# Patient Record
Sex: Female | Born: 1995 | Hispanic: No | Marital: Single | Smoking: Never smoker
Health system: Southern US, Community
[De-identification: ages and names within clinical notes are randomized; demographics above are authoritative.]

## PROBLEM LIST (undated history)

## (undated) DIAGNOSIS — J45909 Unspecified asthma, uncomplicated: Secondary | ICD-10-CM

## (undated) DIAGNOSIS — B009 Herpesviral infection, unspecified: Secondary | ICD-10-CM

## (undated) HISTORY — PX: NO PAST SURGERIES: SHX2092

## (undated) HISTORY — DX: Herpesviral infection, unspecified: B00.9

---
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
## 2017-01-15 ENCOUNTER — Ambulatory Visit: Payer: Self-pay

## 2017-01-16 ENCOUNTER — Ambulatory Visit (INDEPENDENT_AMBULATORY_CARE_PROVIDER_SITE_OTHER): Payer: Self-pay | Admitting: Family Medicine

## 2017-01-16 VITALS — BP 110/63 | HR 58

## 2017-01-16 DIAGNOSIS — Z23 Encounter for immunization: Secondary | ICD-10-CM

## 2017-01-16 NOTE — Progress Notes (Signed)
Patient came into clinic today for PPD test placement. 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 Pt has never tested positive in the past. Pt tolerated PPD placement in left forearm well, no immediate complications. She will return in 2 days for PPD read. She will need print out of negative test.

## 2017-01-17 NOTE — Progress Notes (Signed)
  Agree with above. Forms completed  Nani Gasseratherine Genevieve Ritzel, MD

## 2017-01-18 ENCOUNTER — Ambulatory Visit: Payer: Self-pay

## 2017-01-18 ENCOUNTER — Ambulatory Visit (INDEPENDENT_AMBULATORY_CARE_PROVIDER_SITE_OTHER): Payer: Self-pay | Admitting: Family Medicine

## 2017-01-18 VITALS — BP 110/64 | HR 62 | Wt 121.0 lb

## 2017-01-18 DIAGNOSIS — Z111 Encounter for screening for respiratory tuberculosis: Secondary | ICD-10-CM

## 2017-01-18 LAB — TB SKIN TEST
Induration: 0 mm
TB Skin Test: NEGATIVE

## 2017-01-18 NOTE — Progress Notes (Signed)
Agree with above.  Catherine Metheney, MD  

## 2017-01-18 NOTE — Progress Notes (Signed)
Patient is here for PPD reading. Results were negative 0 mm.

## 2017-02-01 ENCOUNTER — Encounter: Payer: Self-pay | Admitting: Obstetrics & Gynecology

## 2017-02-01 ENCOUNTER — Ambulatory Visit (INDEPENDENT_AMBULATORY_CARE_PROVIDER_SITE_OTHER): Payer: Self-pay | Admitting: Obstetrics & Gynecology

## 2017-02-01 VITALS — BP 105/63 | HR 69 | Resp 16 | Ht 62.0 in | Wt 118.0 lb

## 2017-02-01 DIAGNOSIS — N9089 Other specified noninflammatory disorders of vulva and perineum: Secondary | ICD-10-CM

## 2017-02-01 NOTE — Progress Notes (Signed)
   Subjective:    Patient ID: Sheila [name], female    DOB: 1996-05-28, 21 y.o.   MRN: [id number]  HPI 21yo S AA G0 here with a 1 week h/o vulvar irritation. She is sexually active and monogamous for a year. She thinks that her vulva looks different. She reports that she has had STI testing with this partner and is negative  Review of Systems Her mother is with her and describes her as "dramatic".    Objective:   Physical Exam Well nourished, well hydrated black female, no apparent distress Breathing, conversing, and ambulating normally Her vulva appears entirely normal, normal discharge noted        Assessment & Plan:  Vulvar irritation/appreciated difference- reassurance given Preventative care- info given about Gardasil, rec'd that she get it in the future.

## 2017-04-12 ENCOUNTER — Other Ambulatory Visit: Payer: Self-pay | Admitting: Family Medicine

## 2017-05-08 ENCOUNTER — Other Ambulatory Visit: Payer: Self-pay | Admitting: *Deleted

## 2017-05-08 MED ORDER — NORGESTIM-ETH ESTRAD TRIPHASIC 0.18/0.215/0.25 MG-25 MCG PO TABS: 1.0000 | ORAL_TABLET | Freq: Every day | ORAL | 0 refills | Status: DC

## 2017-06-06 ENCOUNTER — Other Ambulatory Visit: Payer: Self-pay | Admitting: Family Medicine

## 2017-10-18 ENCOUNTER — Other Ambulatory Visit: Payer: Self-pay | Admitting: Family Medicine

## 2017-10-24 ENCOUNTER — Other Ambulatory Visit: Payer: Self-pay

## 2017-10-24 MED ORDER — NORGESTIM-ETH ESTRAD TRIPHASIC 0.18/0.215/0.25 MG-25 MCG PO TABS: 1.0000 | ORAL_TABLET | Freq: Every day | ORAL | 0 refills | Status: DC

## 2017-10-24 NOTE — Telephone Encounter (Signed)
Pt's mother called stating pt needs RF on Birth Control.  Called mother back and left her a VM stating we can send one more month of RFs but pt is due for OV before we can further refill.

## 2017-11-17 ENCOUNTER — Other Ambulatory Visit: Payer: Self-pay | Admitting: Family Medicine

## 2017-11-21 ENCOUNTER — Other Ambulatory Visit: Payer: Self-pay

## 2017-11-21 MED ORDER — NORGESTIM-ETH ESTRAD TRIPHASIC 0.18/0.215/0.25 MG-25 MCG PO TABS: 1.0000 | ORAL_TABLET | Freq: Every day | ORAL | 1 refills | Status: DC

## 2017-11-21 NOTE — Telephone Encounter (Signed)
Pt's mother called and scheduled pt's yearly physical for her. Mother requesting enough of pt's birth control be sent to pharmacy to last until appt on 12-24-17.  RX sent and mother advised that pt has to keep that appt for further refills

## 2017-12-24 ENCOUNTER — Encounter: Payer: Self-pay | Admitting: Family Medicine

## 2017-12-25 ENCOUNTER — Encounter: Payer: Self-pay | Admitting: Family Medicine

## 2017-12-25 ENCOUNTER — Ambulatory Visit (INDEPENDENT_AMBULATORY_CARE_PROVIDER_SITE_OTHER): Payer: Self-pay | Admitting: Family Medicine

## 2017-12-25 VITALS — BP 109/59 | HR 57 | Ht 62.0 in | Wt 122.0 lb

## 2017-12-25 DIAGNOSIS — Z3002 Counseling and instruction in natural family planning to avoid pregnancy: Secondary | ICD-10-CM

## 2017-12-25 DIAGNOSIS — F419 Anxiety disorder, unspecified: Secondary | ICD-10-CM

## 2017-12-25 DIAGNOSIS — R5383 Other fatigue: Secondary | ICD-10-CM

## 2017-12-25 DIAGNOSIS — Z0000 Encounter for general adult medical examination without abnormal findings: Secondary | ICD-10-CM

## 2017-12-25 DIAGNOSIS — F32A Depression, unspecified: Secondary | ICD-10-CM

## 2017-12-25 DIAGNOSIS — F329 Major depressive disorder, single episode, unspecified: Secondary | ICD-10-CM

## 2017-12-25 DIAGNOSIS — Z113 Encounter for screening for infections with a predominantly sexual mode of transmission: Secondary | ICD-10-CM

## 2017-12-25 NOTE — Progress Notes (Signed)
Subjective:     Sheila [name] is a 22 y.o. female and is here for a comprehensive physical exam. The patient reports problems - would like to change birth control.  Currently on the birth control pill but would like to switch medications.  She has been feeling more down and sad and anxious recently.  He says she just does not know why.  She cannot quite put her finger on it.  Right now she is doing summer school classes online at [employer] and also working some.  She is Engineering geologistcoaching cheerleading.  She also wanted to let me know that last Thursday, approximately 6 days ago, she was dropped twice and hit her head.  She is had some pain and discomfort in the back of her head radiating upward.  She says she notices that the headache is worse if she is been trying to focus on something such as reading etc.   She has had some home stressors.   Social History   Socioeconomic History  . Marital status: Single    Spouse name: Not on file  . Number of children: Not on file  . Years of education: Not on file  . Highest education level: Not on file  Occupational History  . Occupation: Consulting civil engineertudent  Social Needs  . Financial resource strain: Not on file  . Food insecurity:    Worry: Not on file    Inability: Not on file  . Transportation needs:    Medical: Not on file    Non-medical: Not on file  Tobacco Use  . Smoking status: Never Smoker  . Smokeless tobacco: Never Used  Substance and Sexual Activity  . Alcohol use: No  . Drug use: No  . Sexual activity: Not on file  Lifestyle  . Physical activity:    Days per week: Not on file    Minutes per session: Not on file  . Stress: Not on file  Relationships  . Social connections:    Talks on phone: Not on file    Gets together: Not on file    Attends religious service: Not on file    Active member of club or organization: Not on file    Attends meetings of clubs or organizations: Not on file    Relationship status: Not on file  . Intimate partner  violence:    Fear of current or ex partner: Not on file    Emotionally abused: Not on file    Physically abused: Not on file    Forced sexual activity: Not on file  Other Topics Concern  . Not on file  Social History Narrative   Track and cheerleading.    Health Maintenance  Topic Date Due  . HIV Screening  09/27/2012  . INFLUENZA VACCINE  01/03/2018  . TETANUS/TDAP  01/16/2018    The following portions of the patient's history were reviewed and updated as appropriate: allergies, current medications, past family history, past medical history, past social history, past surgical history and problem list.  Review of Systems A comprehensive review of systems was negative.   Objective:    BP (!) 109/59   Pulse (!) 57   Ht 5\' 2"  (1.575 m)   Wt 122 lb (55.3 kg)   LMP 12/19/2017   BMI 22.31 kg/m  General appearance: alert, cooperative and appears stated age Head: Normocephalic, without obvious abnormality, atraumatic Eyes: conj clear, EOMi, PEERLA Ears: normal TM's and external ear canals both ears Nose: Nares normal. Septum midline.  Mucosa normal. No drainage or sinus tenderness. Throat: lips, mucosa, and tongue normal; teeth and gums normal Neck: no adenopathy, no carotid bruit, no JVD, supple, symmetrical, trachea midline and thyroid not enlarged, symmetric, no tenderness/mass/nodules Back: symmetric, no curvature. ROM normal. No CVA tenderness. Lungs: clear to auscultation bilaterally Heart: regular rate and rhythm, S1, S2 normal, no murmur, click, rub or gallop Abdomen: soft, non-tender; bowel sounds normal; no masses,  no organomegaly Extremities: extremities normal, atraumatic, no cyanosis or edema Pulses: 2+ and symmetric Skin: Skin color, texture, turgor normal. No rashes or lesions Lymph nodes: Cervical adenopathy: nl Neurologic: Alert and oriented X 3, normal strength and tone. Normal symmetric reflexes. Normal coordination and gait    Assessment:    Healthy  female exam.      Plan:     See After Visit Summary for Counseling Recommendations   Keep up a regular exercise program and make sure you are eating a healthy diet Try to eat 4 servings of dairy a day, or if you are lactose intolerant take a calcium with vitamin D daily.  Your vaccines are up to date.   Fatigue -we will do a CBC and check for low iron.  She is otherwise young and healthy but menstruates regularly so she could have some iron deficiency.  STD Screen -urine GC and chlamydia performed.  Contraceptive counseling-discussed additional options she is most interested in Nexplanon.  We will schedule with Dr. Lyn Hollingshead in about 3 weeks.  HA after head injury.  Possible mild concussion.  She did not actually pass out.  Also consider may just be neck cervical strain a most like whiplash from falling.  Recommend heating pad and gentle stretches.  If still having headaches after week then please let me know.  Avoid doing anything stressful or anything that aggravates the headache.  Depression and anxiety -gad 7 score of 14.  Rates her symptoms is very difficult.  Her PHQ 9 score was 15 and rates her symptoms is somewhat difficult without any suicidal ideation.  Offered to refer her to a therapist/counselor.  Referral placed today she was open to that.  She says she just is not sure why she feels this way.  Certainly could be related to her hormones so we are going to change that and actually she is currently off her birth control for the last week.  Like to see her back in a month to make sure that she is improving.  She will start fall classes again in about 3 weeks.

## 2017-12-25 NOTE — Patient Instructions (Signed)
Schedule with Dr. Lyn HollingsheadAlexander in about 3 weeks when you are due for your next menstrual cycle to have a Nexplanon inserted.

## 2017-12-26 LAB — C. TRACHOMATIS/N. GONORRHOEAE RNA
C. trachomatis RNA, TMA: NOT DETECTED
N. gonorrhoeae RNA, TMA: NOT DETECTED

## 2018-01-22 ENCOUNTER — Ambulatory Visit: Payer: Self-pay | Admitting: Family Medicine

## 2018-01-23 ENCOUNTER — Ambulatory Visit: Payer: Self-pay | Admitting: Osteopathic Medicine

## 2018-07-26 ENCOUNTER — Ambulatory Visit: Payer: Self-pay | Admitting: Family Medicine

## 2018-07-29 ENCOUNTER — Ambulatory Visit (INDEPENDENT_AMBULATORY_CARE_PROVIDER_SITE_OTHER): Payer: BLUE CROSS/BLUE SHIELD | Admitting: Family Medicine

## 2018-07-29 ENCOUNTER — Encounter: Payer: Self-pay | Admitting: Family Medicine

## 2018-07-29 VITALS — BP 116/72 | HR 74 | Ht 62.0 in | Wt 122.0 lb

## 2018-07-29 DIAGNOSIS — L600 Ingrowing nail: Secondary | ICD-10-CM

## 2018-07-29 MED ORDER — DOXYCYCLINE HYCLATE 100 MG PO TABS: 100.0000 mg | ORAL_TABLET | Freq: Two times a day (BID) | ORAL | 0 refills | Status: DC

## 2018-07-29 NOTE — Progress Notes (Signed)
Acute Office Visit  Subjective:    Patient ID: Sheila [name], female    DOB: 19-Oct-1995, 23 y.o.   MRN: [id number]  Chief Complaint  Patient presents with  . Toe Pain    3rd toe L foot     HPI Patient is in today for toe pain on the third toe on her left foot.  She says it is been bothering her for a little while she is not noticed any drainage or pus but is been red and swollen and sore.  It is gotten a lot of dry scabbing and skin around the medial edge of the nail.  She does not remember any injury or trauma per se.  Past Medical History:  Diagnosis Date  . Asthma     No past surgical history on file.  No family history on file.  Social History   Socioeconomic History  . Marital status: Single    Spouse name: Not on file  . Number of children: Not on file  . Years of education: Not on file  . Highest education level: Not on file  Occupational History  . Occupation: Ship broker  Social Needs  . Financial resource strain: Not on file  . Food insecurity:    Worry: Not on file    Inability: Not on file  . Transportation needs:    Medical: Not on file    Non-medical: Not on file  Tobacco Use  . Smoking status: Never Smoker  . Smokeless tobacco: Never Used  Substance and Sexual Activity  . Alcohol use: No  . Drug use: No  . Sexual activity: Not on file  Lifestyle  . Physical activity:    Days per week: Not on file    Minutes per session: Not on file  . Stress: Not on file  Relationships  . Social connections:    Talks on phone: Not on file    Gets together: Not on file    Attends religious service: Not on file    Active member of club or organization: Not on file    Attends meetings of clubs or organizations: Not on file    Relationship status: Not on file  . Intimate partner violence:    Fear of current or ex partner: Not on file    Emotionally abused: Not on file    Physically abused: Not on file    Forced sexual activity: Not on file  Other Topics  Concern  . Not on file  Social History Narrative   Track and cheerleading.     Outpatient Medications Prior to Visit  Medication Sig Dispense Refill  . albuterol (PROVENTIL HFA) 108 (90 BASE) MCG/ACT inhaler Inhale 2 puffs into the lungs as needed. 1 Inhaler 1  . Norgestimate-Ethinyl Estradiol Triphasic (TRI-LO-MARZIA) 0.18/0.215/0.25 MG-25 MCG tab Take 1 tablet by mouth daily. 28 tablet 1   No facility-administered medications prior to visit.     Allergies  Allergen Reactions  . Amoxicillin Anaphylaxis    ROS     Objective:    Physical Exam  BP 116/72   Pulse 74   Ht 5' 2"  (1.575 m)   Wt 122 lb (55.3 kg)   SpO2 100%   BMI 22.31 kg/m  Wt Readings from Last 3 Encounters:  07/29/18 122 lb (55.3 kg)  12/25/17 122 lb (55.3 kg)  02/01/17 118 lb (53.5 kg) (31 %, Z= -0.48)*   * Growth percentiles are based on CDC (Girls, 2-20 Years) data.  Health Maintenance Due  Topic Date Due  . HIV Screening  09/27/2012  . INFLUENZA VACCINE  01/03/2018  . TETANUS/TDAP  01/16/2018    There are no preventive care reminders to display for this patient.   No results found for: TSH Lab Results  Component Value Date   WBC 4.9 12/22/2015   HGB 12.9 12/22/2015   HCT 39.6 12/22/2015   MCV 95.0 12/22/2015   PLT 273 12/22/2015   No results found for: NA, K, CHLORIDE, CO2, GLUCOSE, BUN, CREATININE, BILITOT, ALKPHOS, AST, ALT, PROT, ALBUMIN, CALCIUM, ANIONGAP, EGFR, GFR No results found for: CHOL No results found for: HDL No results found for: LDLCALC No results found for: TRIG No results found for: CHOLHDL No results found for: HGBA1C     Assessment & Plan:   Problem List Items Addressed This Visit    None    Visit Diagnoses    Ingrown nail of third toe of left foot    -  Primary     Ingrown toenail of third toe left foot-with concomitant infection and some early cellulitis.  Discussed options.  We will remove the nail and will also treat with doxycycline.  Patient  tolerated nail removal well.  Call if any problems with antibiotics or if any difficulty with wound care.  Follow-up instructions printed on the AVS for wound care.  Meds ordered this encounter  Medications  . doxycycline (VIBRA-TABS) 100 MG tablet    Sig: Take 1 tablet (100 mg total) by mouth 2 (two) times daily.    Dispense:  20 tablet    Refill:  0   Toenail Avulsion Procedure Note  Pre-operative Diagnosis: Left Ingrown 3rdt toenail   Post-operative Diagnosis: Left Ingrown 3rd toenail  Indications: pain  Anesthesia: Lidocaine 1% without epinephrine without added sodium bicarbonate  Procedure Details  The risks (including bleeding and infection) and benefits of the  procedure and Verbal informed consent obtained.  After digital block anesthesia was obtained, a tourniquet was applied for hemostasis during the procedure.  After prepping with Betadine, the offending edge of the nail was freed from the nailbed and perionychium, and then split with scissors and removed with  forceps.  All visible granulation tissue is debrided. Antibiotic and bulky dressing was applied.   Findings: Ingrown nail.   Complications: none.  Plan: 1. Soak the foot twice daily. Change dressing twice daily until healed over. 2. Warning signs of infection were reviewed.   3. Recommended that the patient use OTC acetaminophen as needed for pain.  4. Return PRN.    Beatrice Lecher, MD

## 2018-07-29 NOTE — Patient Instructions (Signed)
K to remove bandage tomorrow morning.  Okay to get wet in the shower do not scrub at the area just allow soap and water to hit it and then pat dry.  Apply Vaseline or Aquaphor twice a day for the next 2 weeks.  Okay to keep loosely covered for the next couple of days until it scabs over.  After that you do not need to keep it covered.  If at any point you are concerned about how it looks we are happy to take a look at it.  Please make sure to fill the prescription for the antibiotic and pick it up.

## 2019-03-13 ENCOUNTER — Encounter: Payer: Self-pay | Admitting: *Deleted

## 2019-03-13 DIAGNOSIS — Z3400 Encounter for supervision of normal first pregnancy, unspecified trimester: Secondary | ICD-10-CM | POA: Insufficient documentation

## 2019-03-14 ENCOUNTER — Encounter: Payer: Self-pay | Admitting: Advanced Practice Midwife

## 2019-03-14 ENCOUNTER — Other Ambulatory Visit: Payer: Self-pay

## 2019-03-14 ENCOUNTER — Other Ambulatory Visit: Payer: BLUE CROSS/BLUE SHIELD

## 2019-03-14 ENCOUNTER — Ambulatory Visit (INDEPENDENT_AMBULATORY_CARE_PROVIDER_SITE_OTHER): Payer: BLUE CROSS/BLUE SHIELD | Admitting: Advanced Practice Midwife

## 2019-03-14 VITALS — BP 115/75 | HR 74 | Wt 124.0 lb

## 2019-03-14 DIAGNOSIS — Z113 Encounter for screening for infections with a predominantly sexual mode of transmission: Secondary | ICD-10-CM | POA: Diagnosis not present

## 2019-03-14 DIAGNOSIS — Z124 Encounter for screening for malignant neoplasm of cervix: Secondary | ICD-10-CM

## 2019-03-14 DIAGNOSIS — N760 Acute vaginitis: Secondary | ICD-10-CM | POA: Diagnosis not present

## 2019-03-14 DIAGNOSIS — Z3401 Encounter for supervision of normal first pregnancy, first trimester: Secondary | ICD-10-CM

## 2019-03-14 DIAGNOSIS — Z3A10 10 weeks gestation of pregnancy: Secondary | ICD-10-CM | POA: Diagnosis not present

## 2019-03-14 DIAGNOSIS — B9689 Other specified bacterial agents as the cause of diseases classified elsewhere: Secondary | ICD-10-CM

## 2019-03-14 DIAGNOSIS — N898 Other specified noninflammatory disorders of vagina: Secondary | ICD-10-CM

## 2019-03-14 DIAGNOSIS — Z3400 Encounter for supervision of normal first pregnancy, unspecified trimester: Secondary | ICD-10-CM

## 2019-03-14 NOTE — Progress Notes (Signed)
  Subjective:   Sheila [name] is a 23 y.o. G1P0000 at [redacted]w[redacted]d by early ultrasound being seen today for her first obstetrical visit.  Her obstetrical history is significant for none. Patient unsure about intent to breast feed. Pregnancy history fully reviewed.  Patient reports no complaints.  HISTORY: OB History  Gravida Para Term Preterm AB Living  1 0 0 0 0 0  SAB TAB Ectopic Multiple Live Births  0 0 0 0 0    # Outcome Date GA Lbr Len/2nd Weight Sex Delivery Anes PTL Lv  1 Current             Last pap smear was done NA, age  and was NA, age   Past Medical History:  Diagnosis Date  . Asthma    History reviewed. No pertinent surgical history. Family History  Problem Relation Age of Onset  . Hypothyroidism Mother   . Hyperlipidemia Father    Social History   Tobacco Use  . Smoking status: Never Smoker  . Smokeless tobacco: Never Used  Substance Use Topics  . Alcohol use: No  . Drug use: No   Allergies  Allergen Reactions  . Amoxicillin Anaphylaxis   Current Outpatient Medications on File Prior to Visit  Medication Sig Dispense Refill  . Prenatal Vit-Fe Fumarate-FA (PRENATAL VITAMIN PO) Take by mouth.     No current facility-administered medications on file prior to visit.     Review of Systems Pertinent items noted in HPI and remainder of comprehensive ROS otherwise negative.  Exam   Vitals:   03/14/19 0817  BP: 115/75  Pulse: 74  Weight: 124 lb (56.2 kg)      Physical Exam  Constitutional: She is oriented to person, place, and time and well-developed, well-nourished, and in no distress.  Cardiovascular: Normal rate.  Pulmonary/Chest: Effort normal.  Abdominal: Soft. There is no abdominal tenderness. There is no rebound.  Genitourinary:    Genitourinary Comments:  External: no lesion Vagina: large amount of milky, white discharge Cervix: pink, smooth, no CMT, closed/thick  Uterus: approx 11 week size  Adnexa: NT    Neurological: She is alert and  oriented to person, place, and time.  Skin: Skin is warm and dry.  Psychiatric: Affect normal.  Nursing note and vitals reviewed.  Assessment:   Pregnancy: G1P0000 Patient Active Problem List   Diagnosis Date Noted  . Supervision of normal first pregnancy 03/13/2019  . Patellofemoral stress syndrome of left knee 08/13/2014  . Exercise-induced asthma 02/06/2012     Plan:  1. Supervision of normal first pregnancy, antepartum  - Obstetric panel - HIV antibody (with reflex) - Culture, OB Urine - Hemoglobinopathy Evaluation - Cytology - PAP( [place]) - Babyscripts Schedule Optimization - [place] MFM OB COMP + 14 WK; Future - undecided on genetics   Initial labs drawn. Continue prenatal vitamins. Genetic Screening discussed, Quad screen and NIPS: undecided. Ultrasound discussed; fetal anatomic survey: ordered. Problem list reviewed and updated. The nature of [place] with multiple MDs and other Advanced Practice Providers was explained to patient; also emphasized that residents, students are part of our team. Routine obstetric precautions reviewed. 50% of 45 min visit spent in counseling and coordination of care. Return in about 8 weeks (around 05/09/2019) for virtual visit .   Marcille Buffy DNP, CNM  03/14/19  9:07 AM

## 2019-03-14 NOTE — Progress Notes (Signed)
Pt c/o vaginal odor

## 2019-03-17 LAB — OBSTETRIC PANEL
Absolute Monocytes: 760 cells/uL (ref 200–950)
Antibody Screen: NOT DETECTED
Basophils Absolute: 24 cells/uL (ref 0–200)
Basophils Relative: 0.3 %
Eosinophils Absolute: 24 cells/uL (ref 15–500)
Eosinophils Relative: 0.3 %
HCT: 38.4 % (ref 35.0–45.0)
Hemoglobin: 12.9 g/dL (ref 11.7–15.5)
Hepatitis B Surface Ag: NONREACTIVE
Lymphs Abs: 2000 cells/uL (ref 850–3900)
MCH: 31.8 pg (ref 27.0–33.0)
MCHC: 33.6 g/dL (ref 32.0–36.0)
MCV: 94.6 fL (ref 80.0–100.0)
MPV: 9.4 fL (ref 7.5–12.5)
Monocytes Relative: 9.5 %
Neutro Abs: 5192 cells/uL (ref 1500–7800)
Neutrophils Relative %: 64.9 %
Platelets: 317 10*3/uL (ref 140–400)
RBC: 4.06 10*6/uL (ref 3.80–5.10)
RDW: 12.1 % (ref 11.0–15.0)
RPR Ser Ql: NONREACTIVE
Rubella: 1.28 index
Total Lymphocyte: 25 %
WBC: 8 10*3/uL (ref 3.8–10.8)

## 2019-03-17 LAB — URINE CULTURE, OB REFLEX

## 2019-03-17 LAB — HEMOGLOBINOPATHY EVALUATION
Fetal Hemoglobin Testing: <1 % (ref 0.0–1.9)
HCT: 40 % (ref 35.0–45.0)
Hemoglobin A2 - HGBRFX: 2.5 % (ref 1.8–3.5)
Hemoglobin: 12.8 g/dL (ref 11.7–15.5)
Hgb A: 96.5 % (ref >96.0)
MCH: 31.2 pg (ref 27.0–33.0)
MCV: 97.6 fL (ref 80.0–100.0)
RBC: 4.1 10*6/uL (ref 3.80–5.10)
RDW: 11.9 % (ref 11.0–15.0)

## 2019-03-17 LAB — CULTURE, OB URINE

## 2019-03-17 LAB — HIV ANTIBODY (ROUTINE TESTING W REFLEX): HIV 1&2 Ab, 4th Generation: NONREACTIVE

## 2019-03-21 MED ORDER — CLINDAMYCIN HCL 300 MG PO CAPS: 300.0000 mg | ORAL_CAPSULE | Freq: Three times a day (TID) | ORAL | 0 refills | Status: DC

## 2019-03-21 NOTE — Addendum Note (Signed)
Addended by: Marcille Buffy D on: 03/21/2019 08:25 AM   Modules accepted: Orders

## 2019-03-24 LAB — CERVICOVAGINAL ANCILLARY ONLY
Bacterial Vaginitis (gardnerella): POSITIVE — AB
Comment: NEGATIVE
Comment: NEGATIVE
Comment: NEGATIVE

## 2019-03-25 LAB — CYTOLOGY - PAP
Chlamydia: NEGATIVE
Comment: NEGATIVE
Comment: NORMAL
Diagnosis: NEGATIVE
Neisseria Gonorrhea: NEGATIVE

## 2019-03-25 MED ORDER — METRONIDAZOLE 500 MG PO TABS: 500.0000 mg | ORAL_TABLET | Freq: Two times a day (BID) | ORAL | 0 refills | Status: DC

## 2019-03-25 NOTE — Addendum Note (Signed)
Addended by: Marcille Buffy D on: 03/25/2019 10:54 AM   Modules accepted: Orders

## 2019-03-28 ENCOUNTER — Ambulatory Visit (INDEPENDENT_AMBULATORY_CARE_PROVIDER_SITE_OTHER): Payer: BLUE CROSS/BLUE SHIELD

## 2019-03-28 ENCOUNTER — Other Ambulatory Visit: Payer: Self-pay

## 2019-03-28 DIAGNOSIS — Z36 Encounter for antenatal screening for chromosomal anomalies: Secondary | ICD-10-CM | POA: Diagnosis not present

## 2019-03-28 DIAGNOSIS — Z31430 Encounter of female for testing for genetic disease carrier status for procreative management: Secondary | ICD-10-CM | POA: Diagnosis not present

## 2019-03-28 DIAGNOSIS — Z3490 Encounter for supervision of normal pregnancy, unspecified, unspecified trimester: Secondary | ICD-10-CM | POA: Diagnosis not present

## 2019-03-28 NOTE — Addendum Note (Signed)
Addended by: Asencion Islam on: 03/28/2019 09:41 AM   Modules accepted: Orders

## 2019-03-28 NOTE — Progress Notes (Signed)
Pt here for NIPS. Blood drawn

## 2019-04-07 ENCOUNTER — Encounter: Payer: Self-pay | Admitting: *Deleted

## 2019-04-07 DIAGNOSIS — Z3400 Encounter for supervision of normal first pregnancy, unspecified trimester: Secondary | ICD-10-CM

## 2019-05-09 ENCOUNTER — Other Ambulatory Visit: Payer: Self-pay

## 2019-05-09 ENCOUNTER — Ambulatory Visit (HOSPITAL_COMMUNITY)
Admission: RE | Admit: 2019-05-09 | Discharge: 2019-05-09 | Disposition: A | Discharge status: Home / Self Care | Payer: BLUE CROSS/BLUE SHIELD | Source: Ambulatory Visit | Attending: Advanced Practice Midwife | Admitting: Advanced Practice Midwife

## 2019-05-09 ENCOUNTER — Telehealth (INDEPENDENT_AMBULATORY_CARE_PROVIDER_SITE_OTHER): Payer: BLUE CROSS/BLUE SHIELD | Admitting: Certified Nurse Midwife

## 2019-05-09 ENCOUNTER — Other Ambulatory Visit (HOSPITAL_COMMUNITY): Payer: Self-pay | Admitting: *Deleted

## 2019-05-09 DIAGNOSIS — Z3402 Encounter for supervision of normal first pregnancy, second trimester: Secondary | ICD-10-CM

## 2019-05-09 DIAGNOSIS — Z3A19 19 weeks gestation of pregnancy: Secondary | ICD-10-CM | POA: Diagnosis not present

## 2019-05-09 DIAGNOSIS — O359XX0 Maternal care for (suspected) fetal abnormality and damage, unspecified, not applicable or unspecified: Secondary | ICD-10-CM | POA: Diagnosis not present

## 2019-05-09 DIAGNOSIS — Z363 Encounter for antenatal screening for malformations: Secondary | ICD-10-CM

## 2019-05-09 DIAGNOSIS — Z362 Encounter for other antenatal screening follow-up: Secondary | ICD-10-CM

## 2019-05-09 DIAGNOSIS — Z3400 Encounter for supervision of normal first pregnancy, unspecified trimester: Secondary | ICD-10-CM | POA: Diagnosis not present

## 2019-05-09 NOTE — Progress Notes (Signed)
   TELEHEALTH OBSTETRICS PRENATAL VIRTUAL VIDEO VISIT ENCOUNTER NOTE  Provider location: [facility] [place] [facility] [place]   I connected with Sheila [name] on 05/09/19 at  9:00 AM EST by MyChart Video Encounter at home and verified that I am speaking with the correct person using two identifiers.   I discussed the limitations, risks, security and privacy concerns of performing an evaluation and management service virtually and the availability of in person appointments. I also discussed with the patient that there may be a patient responsible charge related to this service. The patient expressed understanding and agreed to proceed. Subjective:  Sheila [name] is a 23 y.o. G1P0000 at [redacted]w[redacted]d being seen today for ongoing prenatal care.  She is currently monitored for the following issues for this low-risk pregnancy and has Exercise-induced asthma; Patellofemoral stress syndrome of left knee; and Supervision of normal first pregnancy on their problem list.  Patient reports no complaints.  Contractions: Not present. Vag. Bleeding: None.  Movement: Present. Denies any leaking of fluid.   The following portions of the patient's history were reviewed and updated as appropriate: allergies, current medications, past family history, past medical history, past social history, past surgical history and problem list.   Objective:   Vitals:   05/09/19 0917  BP: 101/61  Pulse: 80  Weight: 126 lb (57.2 kg)    Fetal Status:     Movement: Present     General:  Alert, oriented and cooperative. Patient is in no acute distress.  Respiratory: Normal respiratory effort, no problems with respiration noted  Mental Status: Normal mood and affect. Normal behavior. Normal judgment and thought content.  Rest of physical exam deferred due to type of encounter  Imaging: No results found.  Assessment and Plan:  Pregnancy: G1P0000 at [redacted]w[redacted]d 1. Supervision of normal first pregnancy, antepartum - anatomy  US today - AFP next week - encouraged flu vaccine  Preterm labor symptoms and general obstetric precautions including but not limited to vaginal bleeding, contractions, leaking of fluid and fetal movement were reviewed in detail with the patient. I discussed the assessment and treatment plan with the patient. The patient was provided an opportunity to ask questions and all were answered. The patient agreed with the plan and demonstrated an understanding of the instructions. The patient was advised to call back or seek an in-person office evaluation/go to [facility] at [facility] for any urgent or concerning symptoms. Please refer to After Visit Summary for other counseling recommendations.   I provided 12 minutes of face-to-face time during this encounter.  Return in about 1 week (around 05/16/2019).  Future Appointments  Date Time Provider [place]  05/09/2019 10:30 AM [place] 1 [facility]  05/16/2019  8:45 AM [facility] NURSE [facility]  06/10/2019  9:40 AM Stann Mainland, Katheran James, CNM [facility]    Julianne Handler, [place] for [employer], [place] [facility]

## 2019-05-15 ENCOUNTER — Telehealth: Payer: Self-pay

## 2019-05-15 NOTE — Telephone Encounter (Signed)
Pt called wanting to cancel appt for AFP. Pt does not want blood work and does not want to reschedule.

## 2019-05-16 ENCOUNTER — Other Ambulatory Visit: Payer: BLUE CROSS/BLUE SHIELD

## 2019-06-06 NOTE — L&D Delivery Note (Addendum)
OB/GYN Faculty Practice Delivery Note  Sheila [name] is a 24 y.o. G1P0000 s/p SVD at [redacted]w[redacted]d. She was admitted for IOL for post dates  ROM: 8h 57m with clear fluid GBS Status: positive  , Clindamycin, adequate Maximum Maternal Temperature: 99.51F  Labor Progress: . Initial SVE: Admitted at 1/40/-3. Given cytotec, FB. AROM at 1718 with amnioinfusion. Received epidural. She then progressed to complete.   Delivery Date/Time: 10/02/19 at 0138 Delivery: Called to room and patient was complete and pushing. Head delivered LOA. Nuchal cord x 1 easily reducible. Shoulder and body delivered in usual fashion. Infant with spontaneous cry, placed on mother's abdomen, dried and stimulated. Cord clamped x 2 after 1-minute delay, and cut by FOB. Cord blood drawn. Placenta delivered spontaneously with gentle cord traction. Fundus firm with massage and Pitocin. Labia, perineum, vagina, and cervix inspected inspected with Right labial tear, repaired with 4-0 Monocryl in the usual fashion.  Baby Weight: pending  Placenta: Sent to L&D Complications: None Lacerations: Right labial tear EBL: 220 mL Analgesia: Epidural   Infant:  APGAR (1 MIN): 8   APGAR (5 MINS): [address], DO, PGY1 10/02/2019, 2:02 AM  OB FELLOW DELIVERY ATTESTATION  I was gloved and present for the delivery in its entirety, and I agree with the above resident's note.    Jerilynn Birkenhead, MD [facility] Family Medicine Fellow, [facility] for [employer], [facility]

## 2019-06-10 ENCOUNTER — Encounter: Payer: Self-pay | Admitting: Certified Nurse Midwife

## 2019-06-10 ENCOUNTER — Telehealth (INDEPENDENT_AMBULATORY_CARE_PROVIDER_SITE_OTHER): Payer: BLUE CROSS/BLUE SHIELD | Admitting: Certified Nurse Midwife

## 2019-06-10 VITALS — BP 104/67 | Wt 130.0 lb

## 2019-06-10 DIAGNOSIS — Z3A23 23 weeks gestation of pregnancy: Secondary | ICD-10-CM

## 2019-06-10 DIAGNOSIS — O43199 Other malformation of placenta, unspecified trimester: Secondary | ICD-10-CM | POA: Insufficient documentation

## 2019-06-10 DIAGNOSIS — Z3400 Encounter for supervision of normal first pregnancy, unspecified trimester: Secondary | ICD-10-CM

## 2019-06-10 DIAGNOSIS — O43192 Other malformation of placenta, second trimester: Secondary | ICD-10-CM

## 2019-06-10 NOTE — Progress Notes (Signed)
   TELEHEALTH OBSTETRICS PRENATAL VIRTUAL VIDEO VISIT ENCOUNTER NOTE  Provider location: Center for [facility] at [place]   I connected with Sheila [name] on 06/10/19 at  9:47 AM EST by MyChart Video Encounter at home and verified that I am speaking with the correct person using two identifiers.   I discussed the limitations, risks, security and privacy concerns of performing an evaluation and management service virtually and the availability of in person appointments. I also discussed with the patient that there may be a patient responsible charge related to this service. The patient expressed understanding and agreed to proceed. Subjective:  Sheila [name] is a 24 y.o. G1P0000 at [redacted]w[redacted]d being seen today for ongoing prenatal care.  She is currently monitored for the following issues for this low-risk pregnancy and has Exercise-induced asthma; Patellofemoral stress syndrome of left knee; Supervision of normal first pregnancy; and Marginal insertion of umbilical cord affecting management of mother on their problem list.  Patient reports no complaints.  Contractions: Not present.  .  Movement: Present. Denies any leaking of fluid.   The following portions of the patient's history were reviewed and updated as appropriate: allergies, current medications, past family history, past medical history, past social history, past surgical history and problem list.   Objective:   Vitals:   06/10/19 0951  BP: 104/67  Weight: 130 lb (59 kg)    Fetal Status:     Movement: Present     General:  Alert, oriented and cooperative. Patient is in no acute distress.  Respiratory: Normal respiratory effort, no problems with respiration noted  Mental Status: Normal mood and affect. Normal behavior. Normal judgment and thought content.  Rest of physical exam deferred due to type of encounter  Imaging: No results found.  Assessment and Plan:  Pregnancy: G1P0000 at [redacted]w[redacted]d 1. Supervision of normal  first pregnancy, antepartum - Patient doing well, no complaints - Encouraged use of babyscripts app and entering BP into babyscripts  - patient reports she is taking BP and writing them down but not entering into babyscipts  - Routine prenatal care - Anticipatory guidance on upcoming appointment with GTT at next appointment, encouraged patient not to eat/drink after MN - patient verbalizes understanding.   2. Marginal insertion of umbilical cord affecting management of mother - f/u US scheduled for 1/8 to assess fetal growth   Preterm labor symptoms and general obstetric precautions including but not limited to vaginal bleeding, contractions, leaking of fluid and fetal movement were reviewed in detail with the patient. I discussed the assessment and treatment plan with the patient. The patient was provided an opportunity to ask questions and all were answered. The patient agreed with the plan and demonstrated an understanding of the instructions. The patient was advised to call back or seek an in-person office evaluation/go to MAU at [facility] for any urgent or concerning symptoms. Please refer to After Visit Summary for other counseling recommendations.   I provided 8 minutes of face-to-face time during this encounter.  Return in about 4 weeks (around 07/08/2019) for ROB/GTT.  Future Appointments  Date Time Provider Department Center  06/13/2019 10:30 AM [facility] NURSE WH-MFC MFC-US  06/13/2019 10:30 AM [facility]-M[facility] [place] 1 WH-MFCUS MFC-US  07/08/2019  8:35 AM Sharyon Cable, CNM [facility]    Sharyon Cable, CNM Center for [employer], [facility]

## 2019-06-13 ENCOUNTER — Ambulatory Visit (HOSPITAL_COMMUNITY): Payer: BLUE CROSS/BLUE SHIELD | Admitting: *Deleted

## 2019-06-13 ENCOUNTER — Ambulatory Visit (HOSPITAL_COMMUNITY)
Admission: RE | Admit: 2019-06-13 | Discharge: 2019-06-13 | Disposition: A | Discharge status: Home / Self Care | Payer: BLUE CROSS/BLUE SHIELD | Source: Ambulatory Visit | Attending: Obstetrics and Gynecology | Admitting: Obstetrics and Gynecology

## 2019-06-13 ENCOUNTER — Encounter: Payer: Self-pay | Admitting: Family Medicine

## 2019-06-13 ENCOUNTER — Other Ambulatory Visit: Payer: Self-pay

## 2019-06-13 ENCOUNTER — Encounter (HOSPITAL_COMMUNITY): Payer: Self-pay

## 2019-06-13 ENCOUNTER — Other Ambulatory Visit (HOSPITAL_COMMUNITY): Payer: Self-pay | Admitting: *Deleted

## 2019-06-13 DIAGNOSIS — O43199 Other malformation of placenta, unspecified trimester: Secondary | ICD-10-CM

## 2019-06-13 DIAGNOSIS — Z3A24 24 weeks gestation of pregnancy: Secondary | ICD-10-CM

## 2019-06-13 DIAGNOSIS — Z362 Encounter for other antenatal screening follow-up: Secondary | ICD-10-CM | POA: Insufficient documentation

## 2019-06-13 DIAGNOSIS — O43192 Other malformation of placenta, second trimester: Secondary | ICD-10-CM | POA: Diagnosis not present

## 2019-06-13 DIAGNOSIS — O43193 Other malformation of placenta, third trimester: Secondary | ICD-10-CM

## 2019-06-16 NOTE — Telephone Encounter (Signed)
Recommend she reach out to her OB/GYN

## 2019-07-08 ENCOUNTER — Ambulatory Visit (INDEPENDENT_AMBULATORY_CARE_PROVIDER_SITE_OTHER): Payer: BLUE CROSS/BLUE SHIELD | Admitting: Certified Nurse Midwife

## 2019-07-08 ENCOUNTER — Encounter: Payer: Self-pay | Admitting: Certified Nurse Midwife

## 2019-07-08 ENCOUNTER — Other Ambulatory Visit: Payer: Self-pay

## 2019-07-08 VITALS — BP 101/70 | HR 74 | Temp 98.3°F | Wt 133.0 lb

## 2019-07-08 DIAGNOSIS — Z3A28 28 weeks gestation of pregnancy: Secondary | ICD-10-CM

## 2019-07-08 DIAGNOSIS — Z3402 Encounter for supervision of normal first pregnancy, second trimester: Secondary | ICD-10-CM

## 2019-07-08 DIAGNOSIS — O43199 Other malformation of placenta, unspecified trimester: Secondary | ICD-10-CM

## 2019-07-08 DIAGNOSIS — O43193 Other malformation of placenta, third trimester: Secondary | ICD-10-CM

## 2019-07-08 NOTE — Progress Notes (Signed)
   PRENATAL VISIT NOTE  Subjective:  Sheila [name] is a 24 y.o. G1P0000 at [redacted]w[redacted]d being seen today for ongoing prenatal care.  She is currently monitored for the following issues for this low-risk pregnancy and has Exercise-induced asthma; Patellofemoral stress syndrome of left knee; Supervision of normal first pregnancy; and Marginal insertion of umbilical cord affecting management of mother on their problem list.  Patient reports no complaints.  Contractions: Not present. Vag. Bleeding: None.  Movement: Present. Denies leaking of fluid.   The following portions of the patient's history were reviewed and updated as appropriate: allergies, current medications, past family history, past medical history, past social history, past surgical history and problem list.   Objective:   Vitals:   07/08/19 0831  BP: 101/70  Pulse: 74  Temp: 98.3 F (36.8 C)  Weight: 133 lb (60.3 kg)    Fetal Status: Fetal Heart Rate (bpm): 159 Fundal Height: 28 cm Movement: Present     General:  Alert, oriented and cooperative. Patient is in no acute distress.  Skin: Skin is warm and dry. No rash noted.   Cardiovascular: Normal heart rate noted  Respiratory: Normal respiratory effort, no problems with respiration noted  Abdomen: Soft, gravid, appropriate for gestational age.  Pain/Pressure: Absent     Pelvic: Cervical exam deferred        Extremities: Normal range of motion.  Edema: None  Mental Status: Normal mood and affect. Normal behavior. Normal judgment and thought content.   Assessment and Plan:  Pregnancy: G1P0000 at [redacted]w[redacted]d 1. Encounter for supervision of normal first pregnancy in second trimester - Patient doing well, no complaints - Routine prenatal care - Anticipatory guidance on upcoming appointments including next being mychart appointment  - Educated and discussed GTT done today and what abnormal results mean  - 2Hr GTT w/ 1 Hr Carpenter 75 g - CBC - RPR - HIV antibody (with reflex)  2.  Marginal insertion of umbilical cord affecting management of mother - follow up fetal growth scheduled for 2/5  - Discussed with patient warning signs and reasons to present to MAU   Preterm labor symptoms and general obstetric precautions including but not limited to vaginal bleeding, contractions, leaking of fluid and fetal movement were reviewed in detail with the patient. Please refer to After Visit Summary for other counseling recommendations.   Return in about 4 weeks (around 08/05/2019) for -mychart, ROB.  Future Appointments  Date Time Provider Department Center  07/11/2019 10:10 AM [facility] NURSE WH-MFC MFC-US  07/11/2019 10:15 AM [facility] [place] 4 WH-MFCUS MFC-US  08/05/2019  3:20 PM Sharyon Cable, CNM [facility]    Sharyon Cable, CNM

## 2019-07-08 NOTE — Patient Instructions (Signed)

## 2019-07-09 LAB — 2HR GTT W 1 HR, CARPENTER, 75 G
Glucose, 1 Hr, Gest: 87 mg/dL (ref 65–179)
Glucose, 2 Hr, Gest: 73 mg/dL (ref 65–152)
Glucose, Fasting, Gest: 71 mg/dL (ref 65–91)

## 2019-07-09 LAB — CBC
HCT: 33.6 % — ABNORMAL LOW (ref 35.0–45.0)
Hemoglobin: 11.5 g/dL — ABNORMAL LOW (ref 11.7–15.5)
MCH: 32.9 pg (ref 27.0–33.0)
MCHC: 34.2 g/dL (ref 32.0–36.0)
MCV: 96 fL (ref 80.0–100.0)
MPV: 8.7 fL (ref 7.5–12.5)
Platelets: 324 10*3/uL (ref 140–400)
RBC: 3.5 10*6/uL — ABNORMAL LOW (ref 3.80–5.10)
RDW: 12.1 % (ref 11.0–15.0)
WBC: 8.5 10*3/uL (ref 3.8–10.8)

## 2019-07-09 LAB — HIV ANTIBODY (ROUTINE TESTING W REFLEX): HIV 1&2 Ab, 4th Generation: NONREACTIVE

## 2019-07-09 LAB — RPR: RPR Ser Ql: NONREACTIVE

## 2019-07-11 ENCOUNTER — Other Ambulatory Visit: Payer: Self-pay

## 2019-07-11 ENCOUNTER — Other Ambulatory Visit (HOSPITAL_COMMUNITY): Payer: Self-pay | Admitting: *Deleted

## 2019-07-11 ENCOUNTER — Encounter (HOSPITAL_COMMUNITY): Payer: Self-pay | Admitting: *Deleted

## 2019-07-11 ENCOUNTER — Ambulatory Visit (HOSPITAL_COMMUNITY): Payer: BLUE CROSS/BLUE SHIELD | Admitting: *Deleted

## 2019-07-11 ENCOUNTER — Ambulatory Visit (HOSPITAL_COMMUNITY)
Admission: RE | Admit: 2019-07-11 | Discharge: 2019-07-11 | Disposition: A | Discharge status: Home / Self Care | Payer: BLUE CROSS/BLUE SHIELD | Source: Ambulatory Visit | Attending: Obstetrics | Admitting: Obstetrics

## 2019-07-11 DIAGNOSIS — Z362 Encounter for other antenatal screening follow-up: Secondary | ICD-10-CM

## 2019-07-11 DIAGNOSIS — Z3400 Encounter for supervision of normal first pregnancy, unspecified trimester: Secondary | ICD-10-CM | POA: Diagnosis not present

## 2019-07-11 DIAGNOSIS — O43199 Other malformation of placenta, unspecified trimester: Secondary | ICD-10-CM | POA: Diagnosis not present

## 2019-07-11 DIAGNOSIS — Z3A28 28 weeks gestation of pregnancy: Secondary | ICD-10-CM

## 2019-07-11 DIAGNOSIS — O43193 Other malformation of placenta, third trimester: Secondary | ICD-10-CM | POA: Diagnosis not present

## 2019-07-27 ENCOUNTER — Encounter: Payer: Self-pay | Admitting: Family Medicine

## 2019-07-29 ENCOUNTER — Encounter: Payer: Self-pay | Admitting: *Deleted

## 2019-07-29 NOTE — Telephone Encounter (Signed)
Okay to provide letter requesting allowance for a pet for emotional support.

## 2019-07-29 NOTE — Telephone Encounter (Signed)
Possibly.  Can she just give [place] a little bit of additional information about what is needed and what the circumstances are.  Is this for an apartment for example?

## 2019-08-05 ENCOUNTER — Telehealth (INDEPENDENT_AMBULATORY_CARE_PROVIDER_SITE_OTHER): Payer: BLUE CROSS/BLUE SHIELD | Admitting: Certified Nurse Midwife

## 2019-08-05 ENCOUNTER — Encounter: Payer: Self-pay | Admitting: Certified Nurse Midwife

## 2019-08-05 DIAGNOSIS — O43199 Other malformation of placenta, unspecified trimester: Secondary | ICD-10-CM

## 2019-08-05 DIAGNOSIS — Z3A32 32 weeks gestation of pregnancy: Secondary | ICD-10-CM

## 2019-08-05 DIAGNOSIS — O43123 Velamentous insertion of umbilical cord, third trimester: Secondary | ICD-10-CM

## 2019-08-05 DIAGNOSIS — Z3400 Encounter for supervision of normal first pregnancy, unspecified trimester: Secondary | ICD-10-CM

## 2019-08-05 NOTE — Progress Notes (Signed)
TELEHEALTH OBSTETRICS PRENATAL VIRTUAL VIDEO VISIT ENCOUNTER NOTE  Provider location: Center for [facility] at [place]   I connected with Sheila [name] on 08/05/19 at  3:25 PM EST by MyChart Video Encounter at home and verified that I am speaking with the correct person using two identifiers.   I discussed the limitations, risks, security and privacy concerns of performing an evaluation and management service virtually and the availability of in person appointments. I also discussed with the patient that there may be a patient responsible charge related to this service. The patient expressed understanding and agreed to proceed. Subjective:  Sheila [name] is a 24 y.o. G1P0000 at [redacted]w[redacted]d being seen today for ongoing prenatal care.  She is currently monitored for the following issues for this low-risk pregnancy and has Exercise-induced asthma; Patellofemoral stress syndrome of left knee; Supervision of normal first pregnancy; and Marginal insertion of umbilical cord affecting management of mother on their problem list.  Patient reports no complaints.  Contractions: Not present.  .  Movement: Present. Denies any leaking of fluid.   The following portions of the patient's history were reviewed and updated as appropriate: allergies, current medications, past family history, past medical history, past social history, past surgical history and problem list.   Objective:   Vitals:   08/05/19 1527  Weight: 140 lb (63.5 kg)    Fetal Status:     Movement: Present     General:  Alert, oriented and cooperative. Patient is in no acute distress.  Respiratory: Normal respiratory effort, no problems with respiration noted  Mental Status: Normal mood and affect. Normal behavior. Normal judgment and thought content.  Rest of physical exam deferred due to type of encounter  Imaging: [place] MFM OB FOLLOW UP  Result Date:  07/11/2019 ----------------------------------------------------------------------  OBSTETRICS REPORT                       (Signed Final 07/11/2019 11:14 am) ---------------------------------------------------------------------- Patient Info  ID #:       [id number]                          D.O.B.:  April 24, 1996 (23 yrs)  Name:       Sheila [name]                   Visit Date: 07/11/2019 10:18 am ---------------------------------------------------------------------- Performed By  Performed By:     Emeline Darling BS,      Ref. Address:     [address]                                                             [place], [place]  Attending:        Ma Rings MD         [facility]  Referred By:      Everardo All ---------------------------------------------------------------------- Orders   #  Description                          Code         Ordered By   1  [place] MFM OB FOLLOW UP                  [phone]     YU FANG  ----------------------------------------------------------------------   #  Order #                    Accession #                 Episode #   1  [id number]                  [id number]                  [id number]  ---------------------------------------------------------------------- Indications   Marginal insertion of umbilical cord affecting O43.193   management of mother in third trimester   Encounter for other antenatal screening        Z36.2   follow-up   [redacted] weeks gestation of pregnancy                Z3A.28   Low Risk NIPS ( Negative Horizon)  ---------------------------------------------------------------------- Fetal Evaluation  Num Of Fetuses:         1  Fetal Heart Rate(bpm):  158  Cardiac Activity:       Observed  Presentation:           Cephalic  Placenta:               Posterior  P. Cord Insertion:      Marginal insertion  Amniotic Fluid  AFI FV:      Within normal limits   AFI Sum(cm)     %Tile       Largest Pocket(cm)  12.3            31          4.45  RUQ(cm)       RLQ(cm)       LUQ(cm)        LLQ(cm)  2.65          2.85          4.45           2.35 ---------------------------------------------------------------------- Biometry  BPD:      72.1  mm     G. Age:  29w 0d         45  %    CI:        71.92   %    70 - 86                                                          FL/HC:      21.0   %    19.6 - 20.8  HC:      270.6  mm     G. Age:  29w 4d         41  %    HC/AC:      1.08  0.99 - 1.21  AC:       250   mm     G. Age:  29w 2d         59  %    FL/BPD:     78.9   %    71 - 87  FL:       56.9  mm     G. Age:  29w 6d         68  %    FL/AC:      22.8   %    20 - 24  Est. FW:    1392  gm      3 lb 1 oz     65  % ---------------------------------------------------------------------- OB History  Blood Type:    A+  Gravidity:    1         Term:   0        Prem:   0        SAB:   0  TOP:          0       Ectopic:  0        Living: 0 ---------------------------------------------------------------------- Gestational Age  LMP:           27w 5d        Date:  12/29/18                 EDD:   10/05/19  U/S Today:     29w 3d                                        EDD:   09/23/19  Best:          28w 5d     Det. By:  U/S  (05/09/19)          EDD:   09/28/19 ---------------------------------------------------------------------- Anatomy  Cranium:               Appears normal         Aortic Arch:            Previously seen  Cavum:                 Appears normal         Ductal Arch:            Previously seen  Ventricles:            Appears normal         Diaphragm:              Previously seen  Choroid Plexus:        Previously seen        Stomach:                Appears normal, left                                                                        sided  Cerebellum:            Previously seen  Abdomen:                Previously seen  Posterior Fossa:       Previously seen         Abdominal Wall:         Previously seen  Nuchal Fold:           Previously seen        Cord Vessels:           Previously seen  Face:                  Orbits and profile     Kidneys:                Appear normal                         previously seen  Lips:                  Previously seen        Bladder:                Appears normal  Thoracic:              Appears normal         Spine:                  Previously seen  Heart:                 Appears normal         Upper Extremities:      Previously seen                         (4CH, axis, and                         situs)  RVOT:                  Previously seen        Lower Extremities:      Previously seen  LVOT:                  Appears normal  Other:  Female gender Heels and 5th digit visualized previously. Nasal bone          visualized previously. Technically difficult due to fetal position. ---------------------------------------------------------------------- Cervix Uterus Adnexa  Cervix  Not visualized (advanced GA >24wks) ---------------------------------------------------------------------- Comments  This patient was seen for a follow up growth scan due to a  marginal placental cord insertion that was noted earlier in her  pregnancy.  She denies any problems since her last exam.  She was informed that the fetal growth and amniotic fluid  level appears appropriate for her gestational age.  A follow up exam was scheduled in 5 weeks. ----------------------------------------------------------------------                   Johnell Comings, MD Electronically Signed Final Report   07/11/2019 11:14 am ----------------------------------------------------------------------   Assessment and Plan:  Pregnancy: G1P0000 at [redacted]w[redacted]d 1. Marginal insertion of umbilical cord affecting management of mother - follow up US scheduled for 3/12 - review of previous ultrasounds have been WNL and fetal growth appropriate   2. Supervision of normal first pregnancy,  antepartum - Patient doing well, no complaints - Has not taken BP today as she is on the  way home from work  - BPs not seen in babyscripts since late Jan, patient reports writing BP on notes then doing a mass transfer of them. Encouraged patient to take BP today when she gets home and upload all BPs into MyChart, patient verbalizes understanding.    Preterm labor symptoms and general obstetric precautions including but not limited to vaginal bleeding, contractions, leaking of fluid and fetal movement were reviewed in detail with the patient. I discussed the assessment and treatment plan with the patient. The patient was provided an opportunity to ask questions and all were answered. The patient agreed with the plan and demonstrated an understanding of the instructions. The patient was advised to call back or seek an in-person office evaluation/go to MAU at [facility] for any urgent or concerning symptoms. Please refer to After Visit Summary for other counseling recommendations.   I provided 8 minutes of face-to-face time during this encounter.  Return in about 4 weeks (around 09/02/2019) for ROB/GBS.  Future Appointments  Date Time Provider Department Center  08/15/2019  3:45 PM [facility]-M[facility] NURSE WH-MFC MFC-US  08/15/2019  3:45 PM [facility]-MFC [place] 2 WH-MFCUS MFC-US  09/02/2019  3:40 PM Sharyon Cable, CNM [facility]    Sharyon Cable, CNM Center for [employer], [facility]

## 2019-08-15 ENCOUNTER — Encounter (HOSPITAL_COMMUNITY): Payer: Self-pay

## 2019-08-15 ENCOUNTER — Other Ambulatory Visit: Payer: Self-pay

## 2019-08-15 ENCOUNTER — Ambulatory Visit (HOSPITAL_COMMUNITY): Payer: BLUE CROSS/BLUE SHIELD | Admitting: *Deleted

## 2019-08-15 ENCOUNTER — Ambulatory Visit (HOSPITAL_COMMUNITY)
Admission: RE | Admit: 2019-08-15 | Discharge: 2019-08-15 | Disposition: A | Discharge status: Home / Self Care | Payer: BLUE CROSS/BLUE SHIELD | Source: Ambulatory Visit | Attending: Obstetrics | Admitting: Obstetrics

## 2019-08-15 DIAGNOSIS — Z3400 Encounter for supervision of normal first pregnancy, unspecified trimester: Secondary | ICD-10-CM | POA: Diagnosis not present

## 2019-08-15 DIAGNOSIS — O43199 Other malformation of placenta, unspecified trimester: Secondary | ICD-10-CM

## 2019-08-15 DIAGNOSIS — Z362 Encounter for other antenatal screening follow-up: Secondary | ICD-10-CM | POA: Diagnosis not present

## 2019-08-15 DIAGNOSIS — O43193 Other malformation of placenta, third trimester: Secondary | ICD-10-CM | POA: Diagnosis not present

## 2019-08-15 DIAGNOSIS — Z3A33 33 weeks gestation of pregnancy: Secondary | ICD-10-CM | POA: Diagnosis not present

## 2019-08-19 ENCOUNTER — Encounter: Payer: Self-pay | Admitting: Certified Nurse Midwife

## 2019-08-27 ENCOUNTER — Encounter: Payer: Self-pay | Admitting: Certified Nurse Midwife

## 2019-08-30 ENCOUNTER — Encounter: Payer: Self-pay | Admitting: Certified Nurse Midwife

## 2019-09-02 ENCOUNTER — Other Ambulatory Visit: Payer: Self-pay

## 2019-09-02 ENCOUNTER — Ambulatory Visit (INDEPENDENT_AMBULATORY_CARE_PROVIDER_SITE_OTHER): Payer: BLUE CROSS/BLUE SHIELD | Admitting: Certified Nurse Midwife

## 2019-09-02 ENCOUNTER — Encounter: Payer: Self-pay | Admitting: Certified Nurse Midwife

## 2019-09-02 VITALS — BP 126/65 | HR 82 | Temp 98.3°F | Wt 145.0 lb

## 2019-09-02 DIAGNOSIS — Z3403 Encounter for supervision of normal first pregnancy, third trimester: Secondary | ICD-10-CM

## 2019-09-02 DIAGNOSIS — Z113 Encounter for screening for infections with a predominantly sexual mode of transmission: Secondary | ICD-10-CM

## 2019-09-02 DIAGNOSIS — O43199 Other malformation of placenta, unspecified trimester: Secondary | ICD-10-CM

## 2019-09-02 DIAGNOSIS — R8271 Bacteriuria: Secondary | ICD-10-CM

## 2019-09-02 NOTE — Progress Notes (Signed)
   PRENATAL VISIT NOTE  Subjective:  Sheila [name] is a 24 y.o. G1P0000 at [redacted]w[redacted]d being seen today for ongoing prenatal care.  She is currently monitored for the following issues for this low-risk pregnancy and has Exercise-induced asthma; Patellofemoral stress syndrome of left knee; Supervision of normal first pregnancy; Marginal insertion of umbilical cord affecting management of mother; and GBS bacteriuria on their problem list.  Patient reports no complaints.  Contractions: Not present. Vag. Bleeding: None.  Movement: Present. Denies leaking of fluid.   The following portions of the patient's history were reviewed and updated as appropriate: allergies, current medications, past family history, past medical history, past social history, past surgical history and problem list.   Objective:   Vitals:   09/02/19 1534  BP: 126/65  Pulse: 82  Temp: 98.3 F (36.8 C)  Weight: 145 lb (65.8 kg)    Fetal Status: Fetal Heart Rate (bpm): 153   Movement: Present     General:  Alert, oriented and cooperative. Patient is in no acute distress.  Skin: Skin is warm and dry. No rash noted.   Cardiovascular: Normal heart rate noted  Respiratory: Normal respiratory effort, no problems with respiration noted  Abdomen: Soft, gravid, appropriate for gestational age.  Pain/Pressure: Absent     Pelvic: Cervical exam deferred      Patient declined cervical examination today wants to wait until next appointment   Extremities: Normal range of motion.  Edema: Trace  Mental Status: Normal mood and affect. Normal behavior. Normal judgment and thought content.   Assessment and Plan:  Pregnancy: G1P0000 at [redacted]w[redacted]d 1. Encounter for supervision of normal first pregnancy in third trimester - patient doing well, no complaints - routine prenatal care  - anticipatory guidance on upcoming appointments with next being in office d/t requesting cervical examination  - preterm labor precautions discussed and reasons to  present to MAU  - Cervicovaginal ancillary only( [place])  2. Marginal insertion of umbilical cord affecting management of mother - Est. FW:  2435  gm    5 lb 6 oz   66  % at 34 weeks  - per MFM no additional f/u US needed   3. GBS bacteriuria - treat in labor, PCN allergy, urine culture showed sensitivity to clinda    Preterm labor symptoms and general obstetric precautions including but not limited to vaginal bleeding, contractions, leaking of fluid and fetal movement were reviewed in detail with the patient. Please refer to After Visit Summary for other counseling recommendations.   Return in about 2 weeks (around 09/16/2019) for ROB.  Future Appointments  Date Time Provider Department Center  09/17/2019  9:10 AM Rasch, Harolyn Rutherford, NP [facility]    Sharyon Cable, CNM

## 2019-09-02 NOTE — Patient Instructions (Signed)
Reasons to go to MAU:  1.  Contractions are  5 minutes apart or less, each last 1 minute, these have been going on for 1-2 hours, and you cannot walk or talk during them 2.  You have a large gush of fluid, or a trickle of fluid that will not stop and you have to wear a pad 3.  You have bleeding that is bright red, heavier than spotting--like menstrual bleeding (spotting can be normal in early labor or after a check of your cervix) 4.  You do not feel the baby moving like he/she normally does   Cervical Ripening: May try one or both  Red Raspberry Leaf capsules:  two 300mg or 400mg tablets with each meal, 2-3 times a day  Potential Side Effects Of Raspberry Leaf:  Most women do not experience any side effects from drinking raspberry leaf tea. However, nausea and loose stools are possible   Evening Primrose Oil capsules: may take 1 to 3 capsules daily. May also prick one to release the oil and insert it into your vagina at night.  Some of the potential side effects:  Upset stomach  Loose stools or diarrhea  Headaches  Nausea:    

## 2019-09-03 LAB — CERVICOVAGINAL ANCILLARY ONLY
Chlamydia: NEGATIVE
Comment: NEGATIVE
Comment: NORMAL
Neisseria Gonorrhea: NEGATIVE

## 2019-09-05 ENCOUNTER — Encounter: Payer: Self-pay | Admitting: Certified Nurse Midwife

## 2019-09-16 ENCOUNTER — Encounter: Payer: Self-pay | Admitting: Certified Nurse Midwife

## 2019-09-17 ENCOUNTER — Other Ambulatory Visit: Payer: Self-pay

## 2019-09-17 ENCOUNTER — Encounter: Payer: Self-pay | Admitting: Certified Nurse Midwife

## 2019-09-17 ENCOUNTER — Ambulatory Visit (INDEPENDENT_AMBULATORY_CARE_PROVIDER_SITE_OTHER): Payer: BLUE CROSS/BLUE SHIELD | Admitting: Obstetrics and Gynecology

## 2019-09-17 DIAGNOSIS — Z3400 Encounter for supervision of normal first pregnancy, unspecified trimester: Secondary | ICD-10-CM

## 2019-09-17 NOTE — Progress Notes (Signed)
   PRENATAL VISIT NOTE  Subjective:  Sheila [name] is a 24 y.o. G1P0000 at [redacted]w[redacted]d being seen today for ongoing prenatal care.  She is currently monitored for the following issues for this low-risk pregnancy and has Exercise-induced asthma; Patellofemoral stress syndrome of left knee; Supervision of normal first pregnancy; Marginal insertion of umbilical cord affecting management of mother; and GBS bacteriuria on their problem list.  Patient reports no complaints.  Contractions: Irritability. Vag. Bleeding: None.  Movement: Present. Denies leaking of fluid.   The following portions of the patient's history were reviewed and updated as appropriate: allergies, current medications, past family history, past medical history, past social history, past surgical history and problem list.   Objective:   Vitals:   09/17/19 0854  BP: 121/74  Pulse: 99  Weight: 145 lb (65.8 kg)    Fetal Status: Fetal Heart Rate (bpm): 147 Fundal Height: 38 cm Movement: Present  Presentation: Undeterminable  General:  Alert, oriented and cooperative. Patient is in no acute distress.  Skin: Skin is warm and dry. No rash noted.   Cardiovascular: Normal heart rate noted  Respiratory: Normal respiratory effort, no problems with respiration noted  Abdomen: Soft, gravid, appropriate for gestational age.  Pain/Pressure: Present     Pelvic: Cervical exam performed in the presence of a chaperone Dilation: Closed Effacement (%): Thick Station: Ballotable  Extremities: Normal range of motion.  Edema: Trace  Mental Status: Normal mood and affect. Normal behavior. Normal judgment and thought content.   Assessment and Plan:  Pregnancy: G1P0000 at [redacted]w[redacted]d 1. Supervision of normal first pregnancy, antepartum  BP good today GBS in urine + discussed treatment in labor Review healthy baby website so patient can attend one of the virtual tour Discussed cervical ripening at home.  Return in one week for cervical check and possible  membranes strip.  Mentioned induction today, patient would like the opportunity to go into labor on her own. Will discuss this more at next visit.   Term labor symptoms and general obstetric precautions including but not limited to vaginal bleeding, contractions, leaking of fluid and fetal movement were reviewed in detail with the patient. Please refer to After Visit Summary for other counseling recommendations.   No follow-ups on file.  Future Appointments  Date Time Provider Department Center  09/24/2019 10:45 AM Jullian Clayson, Harolyn Rutherford, NP [facility]    Venia Carbon, NP

## 2019-09-17 NOTE — Patient Instructions (Addendum)
AREA PEDIATRIC/FAMILY PRACTICE PHYSICIANS  [place] ([address]) . [facility] o Chambliss, MD; Eniola, MD; Hale, MD; Hensel, MD; McDiarmid, MD; McIntyer, MD; Neal, MD; Walden, MD o [address] [place], [place] [address] o [phone] o Mon-Fri 8:30-12:30, 1:30-5:00 o Providers come to see babies at [facility] o Accepting Medicaid . [facility] o Limited providers who accept newborns: Koirala, MD; Morrow, MD; Wolters, MD o [address], [place], [place] [address] o [phone] o Mon-Fri 8:00-5:30 o Babies seen by providers at [facility] o Does NOT accept Medicaid o Please call early in hospitalization for appointment (limited availability)  . [facility] o Mulberry, MD o [address] [place], [place] [address] o [phone] o Mon, Tue, Thur, Fri 8:30-5:00, Wed 10:00-7:00 (closed 1-2pm) o Babies seen by [facility] providers o Accepting Medicaid . [facility] - Pediatrician o Rubin, MD o [address], [place], [place] [address] o [phone] o Mon-Fri 8:30-5:00, Sat 8:30-12:00 o Provider comes to see babies at [facility] o Accepting Medicaid o Must have been referred from current patients or contacted office prior to delivery . [facility] ([facility]) o Brown, MD; Chandler, MD; Ettefagh, MD; Grant, MD; Lester, MD; McCormick, MD; McQueen, MD; Prose, MD; Simha, MD; Stanley, MD; Stryffeler, NP; Tebben, NP o [address], [place], [place] [address] o [phone] o Mon, Tue, Thur, Fri 8:30-5:30, Wed 9:30-5:30, Sat 8:30-12:30 o Babies seen by [facility] providers o Accepting Medicaid o Only accepting infants of first-time parents or siblings of current patients o Hospital discharge coordinator will make follow-up appointment . Jack Amos o [address],  [place], [place]  [address] o [phone]   Fax - [phone] . [facility] o [address], [place], [place]  [address] o Phone - [phone]   Fax - [phone] . Shilpa Gosrani o [address], [place], [place]  [address] o [phone]  [place] ([address]) . [facility] o Bates, MD; [place], MD; Cooper, Cox, MD; MD; Davis, MD; Dovico, MD; Ettefaugh, MD; Little, MD; Lowe, MD; Keiffer, MD; Melvin, MD; Sumner, MD; Williams, MD o [address], [place], [place] [address] o [phone] o Mon-Fri 8:30-5:00 (extended evenings Mon-Thur as needed), Sat-Sun 10:00-1:00 o Providers come to see babies at [facility] o Accepting Medicaid for families of first-time babies and families with all children in the household age 3 and under. Must register with office prior to making appointment (M-F only). . [facility] o Henson, NP; Knapp, MD; Lalonde, MD; Tysinger, PA o [address] [place], [place] [address] o [phone] o Mon-Fri 8:00-5:00 o Babies seen by providers at [facility] o Does NOT accept Medicaid/Commercial Insurance Only . [facility] [place] ([facility])  o Artis, MD; Barnes, MD; Bratton, MD; Coccaro, MD; Lockett Gardner, MD; Kramer, MD; Marshall, MD; Netherton, MD; Poleto, MD; Skinner, MD o [address] [place], [place] [address] o [phone] o Mon-Fri 8:30-5:30, Sat (Oct.-Mar.) 9:00-1:00 o Babies seen by providers at [facility] o Accepting Medicaid  [place] ([address]) . [facility] o Reid, MD; Warner, MD o [address], [place],  [address] o [phone] o Mon-Fri 8:30-5:00, Sat 8:30-12:00 o Providers come to see babies at [facility] o Does NOT accept Medicaid . [facility] o Becker, PA; Hagler, MD; Scifres, PA; Sun, MD; Swayne, MD o [address],  [place], [place] [address] o [phone] o Mon-Fri 8:00-5:00 o Babies seen by providers at [facility] o Does NOT accept Medicaid o Only accepting babies of parents who are patients o Please call early in hospitalization for appointment (limited availability) . [facility] o Clark, MD; Frye, MD; Kelleher, MD; Mack, NP; Miller, MD; O'Keller, MD; Patterson, NP; Pudlo, MD; Puzio, MD; Thomas, MD; Tucker, MD; Twiselton, MD o [address], [place], [place] [address] o [phone] o Mon-Fri 8:00-5:00, Sat 9:00-12:00 o Providers come to see babies at [facility] o Does NOT accept Medicaid  [place] ([address]) . [facility] o Limited providers accepting new patients: Brake, NP; Wharton, PA o [address], [place], [place] [address] o [phone] o Mon-Fri 8:00-5:00 o Babies seen by providers at [facility] o Does NOT accept Medicaid o Only accepting babies of parents who are patients o Please call early in hospitalization for appointment (limited availability) . [facility] o Gay, MD; Quinlan, MD o [address] [place], [place] [address] o [phone] (press [phone] to schedule appointment) o Mon-Fri 8:00-5:00 o Providers come to see babies at [facility] o Does NOT accept Medicaid . [facility] o Mazer, MD o [address] [place], [place] [address] o [phone] o Mon-Fri 8:30-5:00 (lunch 12:30-1:00), extended hours by appointment only Wed 5:00-6:30 o Babies seen by [facility] providers o Accepting Medicaid . [facility] o Banks, MD; Jordan, MD; Koberlein, MD o [address], [place], [place] [address] o [phone] o Mon-Fri 8:00-5:00 o Babies seen by [facility] providers o Does NOT accept Medicaid . [facility] o Parker, MD; Hunter, MD; Wallace, DO o [address] [place], [place]  [address] o [phone] o Mon-Fri 8:00-5:00 o Babies seen by [facility] providers o Does NOT accept Medicaid . [facility] o Brandon, PA; Brecken, PA; Christy, NP; Dees, MD; DeClaire, MD; DeWeese, MD; Hansen, NP; Mills, NP; Parrish, NP; Smoot, NP; Summer, MD; Vapne, MD o [address] [place], [place] [address] o [phone] o Mon-Fri 8:30-5:00, Sat 10:00-1:00 o Providers come to see babies at [facility] o Does NOT accept Medicaid o Free prenatal information session Tuesdays at 4:45pm . [facility] o Bouska, MD; Gordon, PA; Jeffery, PA; Weber, PA o [address] [place] [address] o [phone] o Mon-Fri 7:30-5:30 o Babies seen by [facility] providers . [facility] o [address], [place], [place], [place]  [address] o [phone]   Fax - [phone]  [place] ([phone]) . [facility] o [place]ese, [place] o [address] [place], [place] [address] o [phone] o Mon-Thur 8:00-6:00 o Providers come to see babies at [facility] o Accepting Medicaid . [facility] o Anderson, NP; Badger, MD; Beal, PA; Spencer, PA o [address] [place],  [address] o [phone] o Mon-Thur 7:30-7:30, Fri 7:30-4:30 o Babies seen by [facility] providers o Accepting Medicaid . [facility] o Agbuya, MD; Klett, NP; Romgoolam, MD o [address], ,  [address] o [phone] o Mon-Fri 8:30-5:00, Sat 8:30-12:00 o Providers come to see babies at [facility] o Accepting Medicaid o Must have "Meet & Greet" appointment at office prior to delivery . [facility]  ([facility] ) o McCord,   MD; Juleen China, MD; Clydene Laming, [address], [place], [place] [address] o [phone] o Mon-Wed 8:00-6:00, Thur-Fri 8:00-5:00, Sat 9:00-12:00 o Providers come to  see babies at [facility] o Does NOT accept Medicaid o Only accepting siblings of current patients . [facility] [place], [place], [place], [place]  [address] o [phone]   Fax [phone] . [facility] [place] [address], [place], [place]  [address] o [phone]   Fax - Morton Gorman [phone] & [phone]) . Therapist, music at [place], DO; [place], [address] [place], [place] [address] o [phone] o Mon-Fri 7:00-5:00 o Babies seen by [facility] providers o Does NOT accept Medicaid . [place], MD; [place], [place]; [place], [place], [place], [place] [address] o [phone] o Mon-Fri 8:00-5:00 o Babies seen by [facility] providers o Accepting Medicaid . Lamont, MD; [place], [place]; [place], NP; [place], [place], [place], [place] [address] o [phone] o Mon-Fri 8:00-5:00 o Babies seen by providers at [place] [phone]) . [facility] [place], [place] o [address] [place], [place] [address] o [phone] o Mon-Fri 8:00-5:00 o Babies seen by [facility] providers o Does NOT accept Medicaid o Limited availability, please call early in hospitalization to schedule follow-up . [facility] Leilani Merl, PA; Maisie Fus, MD; [place], MD; [place], [place]; Jeannine Kitten, MD; [place], [place] [facility] [address], [place], [place] [address] o [phone] o Mon-Fri 8:30-5:00, Sat 9:00-12:00 o Babies seen by providers at [facility] o Accepting Medicaid o Please register online then schedule online or call office o [email or web address] . [place] [facility]  [place]) Kristian Covey, NP; Dwyane Dee, MD; Leonidas Romberg, PA o [address] [place], [place], [place] [address] o [phone] o Mon-Fri 8:00-5:00 o Babies seen by providers at [facility] o Accepting Medicaid . [place] [facility] [employer]) Dairl Ponder, MD; Rayvon Char, NP; Melina Modena, MD o [address] [place], [place], [place] [address] o [phone] o Mon-Fri 8:00-5:30, Sat&Sun by appointment (phones open at 8:30) o Babies seen by [facility] providers o Accepting Medicaid o Must be a first-time baby or sibling of current patient . [place], [address], [place], [place]  [address] o [phone]   Fax - [phone]  [place] [phone] & [phone]) . [place], [place]; [place], [place]; Benjamine Mola, MD; [place], [place]; Harrell Lark, MD o [address] [place], [place] [address] o [phone] o Mon-Thur 8:00-7:00, Fri 8:00-5:00, Sat 8:00-12:00, Sun 9:00-12:00 o Babies seen by [facility] providers o Accepting Medicaid . [facility] at [facility], MD; Ruthann Cancer, MD; [facility], MD o [address], [place], [place] [address] o [phone] o Mon-Thur 8:00-5:00 o Babies seen by providers at [facility] o Accepting Medicaid . [facility] at [place], MD; Coe[place], MD; Amedeo Plenty, MD; Bobby Rumpf, MD; List, MD; Lavonia Drafts, MD; Ruthann Cancer, MD; Selinda Eon, MD; Audie Box, MD; Jim Like, MD; Christie Nottingham, MD; Hubbard Hartshorn, MD; Modena Nunnery, MD o [address] [place], [place]  [address] o [phone] o Mon-Fri 8:00-5:30, Sat (Oct.-Mar.) 9:00-1:00 o Babies seen by providers at [facility] o Accepting Medicaid o Must fill out new patient packet, available online at [email or web address] . [facility] [place] [facility] at [facility]) Simone Curia, NP; Tiburcio Pea, NP; Tresa Endo, NP; Whitney Post, MD;  [place], [place]; Hennie Duos, MD; Wynne Dust, MD; Kavin Leech, NP o [address], [place], [place] [address] o [phone] o Mon-Thur 8:00-5:30, Fri 8:00-5:00 o Babies seen by providers at [facility] o Accepting [facility] [phone]) . [facility] o [place], [place]; [place], MD; Tanya Nones, MD; [place], [place] o [address] [place], [place] [address] o [phone] o Mon-Fri 8:00-5:00 o Babies seen by providers at [facility] o Accepting [facility] [phone]) . [facility] o [place], DO; Lenise Arena, MD; [place], [place] o [address], [place], [place] [address] o [phone] o Mon-Fri 8:00-5:00 o Babies seen by providers at [facility] o Does NOT accept Medicaid o Limited appointment availability, please call early in hospitalization  . Nature conservation officer at [facility] o Anson, DO; Bromide, MD o [address] [place], [place] [address] o [phone] o Mon-Fri 8:00-5:00 o Babies seen by [facility] providers o Does NOT accept Medicaid . [facility] Lorrine Kin, MD; Ninetta Lights, MD; [place], [place]; [place], MD o [address], [place], [place] [address] o [phone] o Mon-Fri 8:00-5:00 o After hours clinic [facility] [place], [place] [address]) [phone] Mon-Fri 5:00-8:00, Sat 12:00-6:00, Sun 10:00-4:00 o Babies seen by [facility] providers o Accepting Medicaid . [facility] at [facility] o [address] [place], [place]  [address] o [phone]   Fax - [phone]  [place] [phone]) . Nature conservation officer at [facility], MD o [address] [place], [place], [place] [address] o [phone] o Mon-Fri 8:00-5:00 o Babies seen by [facility] providers o Does NOT accept Medicaid . [facility] - [place] [facility] [place]) Tomi Likens, MD o [address] [place] [address], [place], [place]  [address] o [phone] o Mon-Thur 8:00-7:00, Fri 8:00-5:00, Sat 8:00-12:00 o Babies seen by providers at [facility] o Accepting Medicaid - but does not have vaccinations in office (must be received elsewhere) o Limited availability, please call early in hospitalization  [place] ([address]) . [facility]  o Wyvonne Lenz, MD o [address] [place] [address] o [phone]  Fax [phone]      Cervical Ripening: May try one or both  Red Raspberry Leaf capsules:  two 300mg  or 400mg  tablets with each meal, 2-3 times a day  Potential Side Effects Of Raspberry Leaf:  Most women do not experience any side effects from drinking raspberry leaf tea. However, nausea and loose stools are possible     Evening Primrose Oil capsules: may take 1 to 3 capsules daily. May also prick one to release the oil and insert it into your vagina at night.  Some of the potential side effects:  Upset stomach  Loose stools or diarrhea  Headaches  Nausea:

## 2019-09-18 DIAGNOSIS — Z20828 Contact with and (suspected) exposure to other viral communicable diseases: Secondary | ICD-10-CM | POA: Diagnosis not present

## 2019-09-19 ENCOUNTER — Encounter: Payer: Self-pay | Admitting: Certified Nurse Midwife

## 2019-09-24 ENCOUNTER — Telehealth (INDEPENDENT_AMBULATORY_CARE_PROVIDER_SITE_OTHER): Payer: BLUE CROSS/BLUE SHIELD | Admitting: Obstetrics and Gynecology

## 2019-09-24 ENCOUNTER — Encounter: Payer: Self-pay | Admitting: Obstetrics and Gynecology

## 2019-09-24 DIAGNOSIS — Z3400 Encounter for supervision of normal first pregnancy, unspecified trimester: Secondary | ICD-10-CM

## 2019-09-24 DIAGNOSIS — U071 COVID-19: Secondary | ICD-10-CM | POA: Insufficient documentation

## 2019-09-24 NOTE — Patient Instructions (Signed)

## 2019-09-24 NOTE — Progress Notes (Signed)
Induction Assessment Scheduling Form: Fax to [facility]:  [phone] Route to [facility]-2S Labor Delivery   Sheila [name]                                                                                   DOB:  June 09, 1995                                                            MRN:  [id number]  Phone:  Home Phone [phone]  Mobile [phone]    Provider:  [facility] (Faculty Practice)  GP:  G1P0000                                                            Estimated Date of Delivery: 09/28/19  Dating Criteria: [place]- First trimester   Medical Indications for induction:  Post dates  Admission Date/Time:  10/01/19 @ 11:45 PM Gestational age on admission:  [redacted]w[redacted]d   There were no vitals filed for this visit. HIV:  NON-REACTIVE (02/02 0859) GBS:  Positive in urine, PCN allergy, sensitive to clinda     Method of induction(proposed):  Foley - She is coming to the office 4/28 AM for foley insertion    Scheduling Provider Signature:  Venia Carbon, NP                                            Today's Date:  09/24/2019

## 2019-09-24 NOTE — Progress Notes (Signed)
   TELEHEALTH VIRTUAL OBSTETRICS VISIT ENCOUNTER NOTE  I connected with Sheila [name] on 09/24/19 at 10:45 AM EDT by telephone at home and verified that I am speaking with the correct person using two identifiers.   I discussed the limitations, risks, security and privacy concerns of performing an evaluation and management service by telephone and the availability of in person appointments. I also discussed with the patient that there may be a patient responsible charge related to this service. The patient expressed understanding and agreed to proceed.  Subjective:  Sheila [name] is a 24 y.o. G1P0000 at [redacted]w[redacted]d being followed for ongoing prenatal care.  She is currently monitored for the following issues for this low-risk pregnancy and has Exercise-induced asthma; Patellofemoral stress syndrome of left knee; Supervision of normal first pregnancy; Marginal insertion of umbilical cord affecting management of mother; GBS bacteriuria; and Lab test positive for detection of COVID-19 virus on their problem list.  Patient reports no complaints. Reports fetal movement. Denies any contractions, bleeding or leaking of fluid.   The following portions of the patient's history were reviewed and updated as appropriate: allergies, current medications, past family history, past medical history, past social history, past surgical history and problem list.   Objective:   General:  Alert, oriented and cooperative.   Mental Status: Normal mood and affect perceived. Normal judgment and thought content.  Rest of physical exam deferred due to type of encounter  Assessment and Plan:  Pregnancy: G1P0000 at [redacted]w[redacted]d  1. Lab test positive for detection of COVID-19 virus  4/16: tested positive Had a stuffy nose only Has not had the vaccine.    2. Supervision of normal first pregnancy, antepartum  BP 122/81 Induction orders placed Patient to come into office on 4/28 Am for foley insertion. Discussed this in detail  with patient. Fax IOL form: schedule induction for 4/28 @ 11:45 PM.    Term labor symptoms and general obstetric precautions including but not limited to vaginal bleeding, contractions, leaking of fluid and fetal movement were reviewed in detail with the patient.  I discussed the assessment and treatment plan with the patient. The patient was provided an opportunity to ask questions and all were answered. The patient agreed with the plan and demonstrated an understanding of the instructions. The patient was advised to call back or seek an in-person office evaluation/go to [facility] at [facility] for any urgent or concerning symptoms. Please refer to After Visit Summary for other counseling recommendations.   I provided 11 minutes of non-face-to-face time during this encounter.  Return in about 1 week (around 10/01/2019) for In office for NST and foley insertion, I will schedule induction for 1145 pm 4/28.  No future appointments.  Venia Carbon, NP Center for [employer], [facility]

## 2019-09-25 ENCOUNTER — Telehealth: Payer: Self-pay | Admitting: Obstetrics and Gynecology

## 2019-09-25 ENCOUNTER — Telehealth (HOSPITAL_COMMUNITY): Payer: Self-pay | Admitting: *Deleted

## 2019-09-25 NOTE — Telephone Encounter (Signed)
Preadmission screen  

## 2019-09-25 NOTE — Telephone Encounter (Signed)
Induction Thursday morning. Patient to come to MAU anytime after 5 pm on Wednesday for foley insertion.   Duane Lope, NP 09/25/2019 4:03 PM

## 2019-09-26 ENCOUNTER — Telehealth (HOSPITAL_COMMUNITY): Payer: Self-pay | Admitting: *Deleted

## 2019-09-26 NOTE — Telephone Encounter (Signed)
Preadmission screen  

## 2019-09-29 ENCOUNTER — Other Ambulatory Visit (HOSPITAL_COMMUNITY): Payer: BLUE CROSS/BLUE SHIELD

## 2019-09-29 ENCOUNTER — Other Ambulatory Visit: Payer: Self-pay | Admitting: Family Medicine

## 2019-09-30 ENCOUNTER — Ambulatory Visit (INDEPENDENT_AMBULATORY_CARE_PROVIDER_SITE_OTHER): Payer: BLUE CROSS/BLUE SHIELD | Admitting: Certified Nurse Midwife

## 2019-09-30 ENCOUNTER — Other Ambulatory Visit: Payer: Self-pay

## 2019-09-30 VITALS — BP 108/72 | HR 78 | Temp 98.2°F | Wt 147.0 lb

## 2019-09-30 DIAGNOSIS — U071 COVID-19: Secondary | ICD-10-CM

## 2019-09-30 DIAGNOSIS — R8271 Bacteriuria: Secondary | ICD-10-CM

## 2019-09-30 DIAGNOSIS — Z3400 Encounter for supervision of normal first pregnancy, unspecified trimester: Secondary | ICD-10-CM

## 2019-09-30 NOTE — Patient Instructions (Signed)
Balloon Catheter Placement for Cervical Ripening Balloon catheter placement for cervical ripening is a procedure to help your cervix start to soften (ripen) and open (dilate). It is done to prepare your body for labor induction. During this procedure, a thin tube (catheter) is placed through your cervix. A tiny balloon attached to the catheter is inflated with water. Pressure from the balloon is what helps your cervix start to open. Cervical ripening with a balloon catheter can make labor induction shorter and easier. You may have this procedure if:  Your cervix is not ready for labor.  Your health care provider has planned labor induction.  You are not having twins or multiples.  Your baby is in the head-down position.  You do not have any other pregnancy complications that require you to be monitored in the hospital after balloon catheter placement. If your health care provider has recommended labor induction to stimulate a vaginal birth, this procedure may be started the day before induction. You will go home with the balloon in place and return to start induction in 12-24 hours. You may have this procedure and stay in the hospital so that your progress can be monitored as well. Tell a health care provider about:  Any allergies you have.  All medicines you are taking, including vitamins, herbs, eye drops, creams, and over-the-counter medicines.  Any blood disorders you have.  Any surgeries you have had.  Any medical conditions you have. What are the risks? Generally, this is a safe procedure. However, problems may occur, including:  Infection.  Bleeding.  Cramping or pain.  Difficulty passing urine.  The baby moving from the head-down position to a position with the feet or buttocks down (breech position). What happens before the procedure?  Your health care provider may check your baby's heartbeat (fetal monitoring) before the procedure.  You may be asked to empty your  bladder. What happens during the procedure?   You will be positioned on the exam table as if you were having a pelvic exam or Pap test.  Your health care provider may insert a medical instrument into your vagina (speculum) to see your cervix.  Your cervix may be cleaned with a germ-killing solution.  The catheter will be inserted through the opening of your cervix.  A balloon on the end of the catheter will be inflated with sterile water. Some catheters have two balloons, one on each side of the cervix.  Depending on the type of balloon catheter, the end of the catheter may be left free outside your cervix or taped to your leg. The procedure may vary among health care providers and hospitals. What can I expect after the procedure?  After the procedure, it is common to have: ? A feeling of pressure inside your vagina. ? Light vaginal bleeding (spotting).  You may have fetal monitoring before going home.  You may be sent home with the catheter in place and asked to return to start your induction in about 12-24 hours. Follow these instructions at home:  Take over-the-counter and prescription medicines only as told by your health care provider.  Return to your normal activities at home as told by your health care provider. Ask your health care provider what activities are safe for you. Do not leave home until you return for labor induction.  You may shower at home. Do not take baths, swim, or use a hot tub unless your health care provider approves.  As your cervix opens, your catheter and balloon may   fall out before you return for labor induction. Ask your health care provider what you should do if this happens.  Keep all follow-up visits as told by your health care provider. This is important. You will need to return to start induction as told by your health care provider. Contact your health care provider if:  You have chills or a fever.  You have constant pain or cramps (not  contractions).  You have trouble passing urine.  Your water breaks.  You have vaginal bleeding that is heavier than spotting.  You have contractions that start to last longer and come closer together (about every 5 minutes).  The balloon catheter falls out before you return to start your induction. Summary  Cervical ripening with placement of a balloon catheter is an outpatient procedure to prepare you for labor induction.  Cervical ripening with a balloon catheter helps your cervix start to open for birth.  You will be positioned on the exam table. The catheter will be inserted through the opening of your cervix. A balloon on the end of the catheter will be inflated with water.  Pressure from the balloon will cause ripening of your cervix. You will go home with the balloon in place and return to start induction in 12-24 hours.  Contact your health care provider if you have pain, fever, vaginal bleeding, or trouble passing urine. Also contact him or her if your water breaks, you start to go into labor, or your balloon catheter falls out before you return to start your induction. This information is not intended to replace advice given to you by your health care provider. Make sure you discuss any questions you have with your health care provider. Document Revised: 01/18/2018 Document Reviewed: 01/18/2018 Elsevier Patient Education  2020 [employer]  

## 2019-09-30 NOTE — Progress Notes (Signed)
   PRENATAL VISIT NOTE  Subjective:  Sheila [name] is a 24 y.o. G1P0000 at [redacted]w[redacted]d being seen today for ongoing prenatal care.  She is currently monitored for the following issues for this low-risk pregnancy and has Exercise-induced asthma; Patellofemoral stress syndrome of left knee; Supervision of normal first pregnancy; Marginal insertion of umbilical cord affecting management of mother; GBS bacteriuria; and Lab test positive for detection of COVID-19 virus on their problem list.  Patient reports occasional contractions.  Contractions: Irritability. Vag. Bleeding: None.  Movement: Present. Denies leaking of fluid.   The following portions of the patient's history were reviewed and updated as appropriate: allergies, current medications, past family history, past medical history, past social history, past surgical history and problem list.   Objective:   Vitals:   09/30/19 1500  BP: 108/72  Pulse: 78  Temp: 98.2 F (36.8 C)  Weight: 147 lb (66.7 kg)    Fetal Status: Fetal Heart Rate (bpm): 145   Movement: Present  Presentation: Vertex  General:  Alert, oriented and cooperative. Patient is in no acute distress.  Skin: Skin is warm and dry. No rash noted.   Cardiovascular: Normal heart rate noted  Respiratory: Normal respiratory effort, no problems with respiration noted  Abdomen: Soft, gravid, appropriate for gestational age.  Pain/Pressure: Present     Pelvic: Cervical exam performed in the presence of a chaperone Dilation: 1 Effacement (%): Thick Station: Ballotable  Extremities: Normal range of motion.  Edema: Trace  Mental Status: Normal mood and affect. Normal behavior. Normal judgment and thought content.   Assessment and Plan:  Pregnancy: G1P0000 at [redacted]w[redacted]d 1. Supervision of normal first pregnancy, antepartum - Patient doing well, reports occasional pressure and contractions  - Patient has induction scheduled for Thursday AM  - Educated and discussed induction process with  patient including foley bulb, cytotec, pitocin  - Discussed with patient outpatient foley bulb placement that can be scheduled to occur tomorrow. Patient declines at this time and wants to wait until Thursday  - Patient request membrane sweep today in office, performed  - Labor precautions and reasons to present to MAU discussed   2. GBS bacteriuria - treat in labor   3. Lab test positive for detection of COVID-19 virus - no repeat testing needed for IOL on 4/29    Term labor symptoms and general obstetric precautions including but not limited to vaginal bleeding, contractions, leaking of fluid and fetal movement were reviewed in detail with the patient. Please refer to After Visit Summary for other counseling recommendations.    Future Appointments  Date Time Provider Department Center  10/02/2019  6:30 AM [facility] SCHED ROOM [facility] None  11/14/2019  9:30 AM Donette Larry, CNM [facility]    Sharyon Cable, CNM

## 2019-10-01 ENCOUNTER — Inpatient Hospital Stay (HOSPITAL_COMMUNITY)
Admission: AD | Admit: 2019-10-01 | Discharge: 2019-10-04 | DRG: 807 | Disposition: A | Discharge status: Home / Self Care | Payer: BLUE CROSS/BLUE SHIELD | Attending: Obstetrics and Gynecology | Admitting: Obstetrics and Gynecology

## 2019-10-01 ENCOUNTER — Inpatient Hospital Stay (HOSPITAL_COMMUNITY): Payer: BLUE CROSS/BLUE SHIELD | Admitting: Anesthesiology

## 2019-10-01 ENCOUNTER — Inpatient Hospital Stay (HOSPITAL_COMMUNITY): Payer: BLUE CROSS/BLUE SHIELD

## 2019-10-01 ENCOUNTER — Encounter (HOSPITAL_COMMUNITY): Payer: Self-pay | Admitting: Obstetrics and Gynecology

## 2019-10-01 ENCOUNTER — Other Ambulatory Visit: Payer: Self-pay

## 2019-10-01 DIAGNOSIS — O480 Post-term pregnancy: Principal | ICD-10-CM | POA: Diagnosis present

## 2019-10-01 DIAGNOSIS — O43123 Velamentous insertion of umbilical cord, third trimester: Secondary | ICD-10-CM | POA: Diagnosis not present

## 2019-10-01 DIAGNOSIS — R8271 Bacteriuria: Secondary | ICD-10-CM | POA: Diagnosis present

## 2019-10-01 DIAGNOSIS — Z8616 Personal history of COVID-19: Secondary | ICD-10-CM

## 2019-10-01 DIAGNOSIS — Z3A40 40 weeks gestation of pregnancy: Secondary | ICD-10-CM

## 2019-10-01 DIAGNOSIS — U071 COVID-19: Secondary | ICD-10-CM | POA: Diagnosis present

## 2019-10-01 DIAGNOSIS — O43199 Other malformation of placenta, unspecified trimester: Secondary | ICD-10-CM | POA: Diagnosis present

## 2019-10-01 DIAGNOSIS — O99824 Streptococcus B carrier state complicating childbirth: Secondary | ICD-10-CM | POA: Diagnosis not present

## 2019-10-01 DIAGNOSIS — Z3400 Encounter for supervision of normal first pregnancy, unspecified trimester: Secondary | ICD-10-CM

## 2019-10-01 LAB — CBC
HCT: 36.4 % (ref 36.0–46.0)
Hemoglobin: 11.5 g/dL — ABNORMAL LOW (ref 12.0–15.0)
MCH: 28.9 pg (ref 26.0–34.0)
MCHC: 31.6 g/dL (ref 30.0–36.0)
MCV: 91.5 fL (ref 80.0–100.0)
Platelets: 399 10*3/uL (ref 150–400)
RBC: 3.98 MIL/uL (ref 3.87–5.11)
RDW: 15 % (ref 11.5–15.5)
WBC: 15.4 10*3/uL — ABNORMAL HIGH (ref 4.0–10.5)
nRBC: 0 % (ref 0.0–0.2)

## 2019-10-01 LAB — TYPE AND SCREEN
ABO/RH(D): A POS
Antibody Screen: NEGATIVE

## 2019-10-01 LAB — ABO/RH: ABO/RH(D): A POS

## 2019-10-01 MED ORDER — BUTORPHANOL TARTRATE 1 MG/ML IJ SOLN
INTRAMUSCULAR | Status: AC
  Administered 2019-10-01: 15:00:00 1 mg via INTRAVENOUS
  Filled 2019-10-01: qty 2

## 2019-10-01 MED ORDER — LIDOCAINE HCL (PF) 1 % IJ SOLN
INTRAMUSCULAR | Status: DC | PRN
  Administered 2019-10-01 (×2): 4 mL via EPIDURAL

## 2019-10-01 MED ORDER — LACTATED RINGERS IV SOLN
500.0000 mL | Freq: Once | INTRAVENOUS | Status: AC
  Administered 2019-10-01: 16:00:00 500 mL via INTRAVENOUS

## 2019-10-01 MED ORDER — OXYTOCIN BOLUS FROM INFUSION
500.0000 mL | Freq: Once | INTRAVENOUS | Status: AC
  Administered 2019-10-02: 02:00:00 500 mL via INTRAVENOUS

## 2019-10-01 MED ORDER — FENTANYL-BUPIVACAINE-NACL 0.5-0.125-0.9 MG/250ML-% EP SOLN: 12.0000 mL/h | EPIDURAL | Status: DC | PRN

## 2019-10-01 MED ORDER — LACTATED RINGERS IV SOLN: 500.0000 mL | INTRAVENOUS | Status: DC | PRN

## 2019-10-01 MED ORDER — SOD CITRATE-CITRIC ACID 500-334 MG/5ML PO SOLN: 30.0000 mL | ORAL | Status: DC | PRN

## 2019-10-01 MED ORDER — EPHEDRINE 5 MG/ML INJ: 10.0000 mg | INTRAVENOUS | Status: DC | PRN

## 2019-10-01 MED ORDER — ONDANSETRON HCL 4 MG/2ML IJ SOLN
4.0000 mg | Freq: Four times a day (QID) | INTRAMUSCULAR | Status: DC | PRN
  Administered 2019-10-01 (×2): 4 mg via INTRAVENOUS
  Filled 2019-10-01 (×2): qty 2

## 2019-10-01 MED ORDER — PHENYLEPHRINE 40 MCG/ML (10ML) SYRINGE FOR IV PUSH (FOR BLOOD PRESSURE SUPPORT): 80.0000 ug | PREFILLED_SYRINGE | INTRAVENOUS | Status: DC | PRN

## 2019-10-01 MED ORDER — LIDOCAINE HCL (PF) 1 % IJ SOLN: 30.0000 mL | INTRAMUSCULAR | Status: DC | PRN

## 2019-10-01 MED ORDER — TERBUTALINE SULFATE 1 MG/ML IJ SOLN: 0.2500 mg | Freq: Once | INTRAMUSCULAR | Status: DC | PRN

## 2019-10-01 MED ORDER — DIPHENHYDRAMINE HCL 50 MG/ML IJ SOLN: 12.5000 mg | INTRAMUSCULAR | Status: DC | PRN

## 2019-10-01 MED ORDER — OXYTOCIN 40 UNITS IN NORMAL SALINE INFUSION - SIMPLE MED
1.0000 m[IU]/min | INTRAVENOUS | Status: DC
  Administered 2019-10-01: 21:00:00 2 m[IU]/min via INTRAVENOUS
  Filled 2019-10-01: qty 1000

## 2019-10-01 MED ORDER — FENTANYL-BUPIVACAINE-NACL 0.5-0.125-0.9 MG/250ML-% EP SOLN
EPIDURAL | Status: AC
  Filled 2019-10-01: qty 250

## 2019-10-01 MED ORDER — FENTANYL CITRATE (PF) 100 MCG/2ML IJ SOLN
100.0000 ug | INTRAMUSCULAR | Status: DC | PRN
  Administered 2019-10-01: 14:00:00 100 ug via INTRAVENOUS
  Filled 2019-10-01: qty 2

## 2019-10-01 MED ORDER — CLINDAMYCIN PHOSPHATE 900 MG/50ML IV SOLN
900.0000 mg | Freq: Three times a day (TID) | INTRAVENOUS | Status: DC
  Administered 2019-10-01 (×2): 900 mg via INTRAVENOUS
  Filled 2019-10-01 (×2): qty 50

## 2019-10-01 MED ORDER — LACTATED RINGERS AMNIOINFUSION: INTRAVENOUS | Status: DC

## 2019-10-01 MED ORDER — OXYTOCIN 40 UNITS IN NORMAL SALINE INFUSION - SIMPLE MED
2.5000 [IU]/h | INTRAVENOUS | Status: DC
  Administered 2019-10-02: 02:00:00 2.5 [IU]/h via INTRAVENOUS

## 2019-10-01 MED ORDER — BUTORPHANOL TARTRATE 1 MG/ML IJ SOLN
1.0000 mg | Freq: Once | INTRAMUSCULAR | Status: AC
  Administered 2019-10-01: 1 mg via INTRAMUSCULAR

## 2019-10-01 MED ORDER — MISOPROSTOL 50MCG HALF TABLET
50.0000 ug | ORAL_TABLET | ORAL | Status: DC | PRN
Start: 2019-10-01 — End: 2019-10-02
  Administered 2019-10-01: 13:00:00 50 ug via BUCCAL
  Filled 2019-10-01: qty 1

## 2019-10-01 MED ORDER — OXYCODONE-ACETAMINOPHEN 5-325 MG PO TABS: 2.0000 | ORAL_TABLET | ORAL | Status: DC | PRN

## 2019-10-01 MED ORDER — SODIUM CHLORIDE (PF) 0.9 % IJ SOLN
INTRAMUSCULAR | Status: DC | PRN
  Administered 2019-10-01: 12 mL/h via EPIDURAL

## 2019-10-01 MED ORDER — BUTORPHANOL TARTRATE 1 MG/ML IJ SOLN: 1.0000 mg | Freq: Once | INTRAMUSCULAR | Status: AC

## 2019-10-01 MED ORDER — OXYCODONE-ACETAMINOPHEN 5-325 MG PO TABS: 1.0000 | ORAL_TABLET | ORAL | Status: DC | PRN

## 2019-10-01 MED ORDER — LACTATED RINGERS IV SOLN: INTRAVENOUS | Status: DC

## 2019-10-01 MED ORDER — ACETAMINOPHEN 325 MG PO TABS: 650.0000 mg | ORAL_TABLET | ORAL | Status: DC | PRN

## 2019-10-01 NOTE — Anesthesia Procedure Notes (Signed)
Epidural Patient location during procedure: OB  Staffing Anesthesiologist: Lewie Loron, MD Performed: anesthesiologist   Preanesthetic Checklist Completed: patient identified, IV checked, risks and benefits discussed, monitors and equipment checked, pre-op evaluation and timeout performed  Epidural Patient position: sitting Prep: DuraPrep and site prepped and draped Patient monitoring: heart rate, continuous pulse ox and blood pressure Approach: midline Location: L2-L3 Injection technique: LOR air and LOR saline  Needle:  Needle type: Tuohy  Needle gauge: 17 G Needle length: 9 cm Needle insertion depth: 5 cm Catheter type: closed end flexible Catheter size: 19 Gauge Catheter at skin depth: 10 cm Test dose: negative  Assessment Sensory level: T8 Events: blood not aspirated, injection not painful, no injection resistance, no paresthesia and negative IV test  Additional Notes Reason for block:procedure for pain

## 2019-10-01 NOTE — Progress Notes (Addendum)
Labor Progress Note Sheila [name] is a 24 y.o. G1P0000 at [redacted]w[redacted]d presented for IOL for post dates.   S: Patient uncomfortable and feeling contractions.  Requesting epidural.   O:  BP (!) 94/57   Pulse 70   Temp 98.8 F (37.1 C) (Oral)   Resp 20   Ht 5' 2.5" (1.588 m)   Wt 67.9 kg   LMP 12/29/2018 (Approximate)   BMI 26.93 kg/m  EFM: 135/mod varibility/pos accels, occ late decels TOCO: every 2-3 minutes   CVE: Dilation: 6 Effacement (%): 40 Cervical Position: (left side) Station: -3 Presentation: Vertex Exam by:: Viona Gilmore RN   A&P: 24 y.o. G1P0000 [redacted]w[redacted]d here for IOL for post dates. #Labor: Progressing well s/p Cytotec x1. Anticipate vaginal delivery.  #Pain: stadol, requests epidural  #FWB: Cat 2, reassuring for moderate variability  #GBS positive bacteruria, Clindamycin    Katha Cabal, DO 3:44 PM

## 2019-10-01 NOTE — H&P (Signed)
LABOR AND DELIVERY ADMISSION HISTORY AND PHYSICAL NOTE  Sheila [name] is a 24 y.o. female G1P0000 with IUP at [redacted]w[redacted]d by 19wk [place] presenting for post dates IOL.   She reports positive fetal movement. She denies leakage of fluid, vaginal bleeding. She has been having intermittent contractions since her membranes were stripped yesterday.   She plans on bottle feeding. Her contraception plan is: undecided but leaning towards LARC.  Prenatal History/Complications: PNC at [facility]:  @[redacted]w[redacted]d , CWD, normal anatomy, cephalic presentation, posterior placenta, 66%ile, EFW 2435 g  Pregnancy complications:  - COVID+ 4/16 - GBS bacteriuria - marginal cord insertion w serial growth scans  Past Medical History: Past Medical History:  Diagnosis Date  . Asthma     Past Surgical History: Past Surgical History:  Procedure Laterality Date  . NO PAST SURGERIES      Obstetrical History: OB History    Gravida  1   Para  0   Term  0   Preterm  0   AB  0   Living  0     SAB  0   TAB  0   Ectopic  0   Multiple  0   Live Births  0           Social History: Social History   Socioeconomic History  . Marital status: Single    Spouse name: Not on file  . Number of children: Not on file  . Years of education: Not on file  . Highest education level: Not on file  Occupational History  . Occupation: Ship broker  Tobacco Use  . Smoking status: Never Smoker  . Smokeless tobacco: Never Used  Substance and Sexual Activity  . Alcohol use: No  . Drug use: No  . Sexual activity: Yes    Birth control/protection: None  Other Topics Concern  . Not on file  Social History Narrative   Track and cheerleading.    Social Determinants of Health   Financial Resource Strain:   . Difficulty of Paying Living Expenses:   Food Insecurity:   . Worried About Charity fundraiser in the Last Year:   . Arboriculturist in the Last Year:   Transportation Needs:   . Film/video editor  (Medical):   Marland Kitchen Lack of Transportation (Non-Medical):   Physical Activity:   . Days of Exercise per Week:   . Minutes of Exercise per Session:   Stress:   . Feeling of Stress :   Social Connections:   . Frequency of Communication with Friends and Family:   . Frequency of Social Gatherings with Friends and Family:   . Attends Religious Services:   . Active Member of Clubs or Organizations:   . Attends Archivist Meetings:   Marland Kitchen Marital Status:     Family History: Family History  Problem Relation Age of Onset  . Hypothyroidism Mother   . Hyperlipidemia Father     Allergies: Allergies  Allergen Reactions  . Amoxicillin Anaphylaxis    Medications Prior to Admission  Medication Sig Dispense Refill Last Dose  . Prenatal Vit-Fe Fumarate-FA (PRENATAL VITAMIN PO) Take by mouth.   Past Week at Unknown time     Review of Systems  All systems reviewed and negative except as stated in HPI  Physical Exam Blood pressure 118/79, pulse 83, resp. rate 18, height 5' 2.5" (1.588 m), weight 67.9 kg, last menstrual period 12/29/2018. General appearance: alert, oriented, NAD Lungs: normal respiratory effort Heart:  regular rate Abdomen: soft, non-tender; gravid Extremities: No calf swelling or tenderness Presentation: cephalic by SVE  Fetal monitoringBaseline: 145 bpm, Variability: Good {> 6 bpm), Accelerations: Reactive and Decelerations: Absent Uterine activity: q5-6 min  Dilation: 1 Effacement (%): 40 Station: -3 Exam by:: Viona Gilmore RN  Prenatal labs: ABO, Rh: --/--/A POS (04/28 1221) Antibody: PENDING (04/28 1221) Rubella: 1.28 (10/09 0822) RPR: NON-REACTIVE (02/02 0859)  HBsAg: NON-REACTIVE (10/09 6433)  HIV: NON-REACTIVE (02/02 0859)  GC/Chlamydia: neg/neg 09/02/2019  GBS:   +UCx 2-hr GTT: normal 07/08/2019 (29,51,88) Genetic screening:  Low risk panorama Anatomy US: marginal cord insertion, otherwise normal  Prenatal Transfer Tool  Maternal Diabetes: No Genetic  Screening: Normal Maternal Ultrasounds/Referrals: Normal Fetal Ultrasounds or other Referrals:  None Maternal Substance Abuse:  No Significant Maternal Medications:  None Significant Maternal Lab Results: Group B Strep positive  Results for orders placed or performed during the hospital encounter of 10/01/19 (from the past 24 hour(s))  Type and screen   Collection Time: 10/01/19 12:21 PM  Result Value Ref Range   ABO/RH(D) A POS    Antibody Screen PENDING    Sample Expiration      10/04/2019,2359 Performed at [facility], [address], [place], [place] [address]   CBC   Collection Time: 10/01/19 12:30 PM  Result Value Ref Range   WBC 15.4 (H) 4.0 - 10.5 K/uL   RBC 3.98 3.87 - 5.11 MIL/uL   Hemoglobin 11.5 (L) 12.0 - 15.0 g/dL   HCT 63.0 16.0 - 10.9 %   MCV 91.5 80.0 - 100.0 fL   MCH 28.9 26.0 - 34.0 pg   MCHC 31.6 30.0 - 36.0 g/dL   RDW 32.3 55.7 - 32.2 %   Platelets 399 150 - 400 K/uL   nRBC 0.0 0.0 - 0.2 %    Patient Active Problem List   Diagnosis Date Noted  . Post-dates pregnancy 10/01/2019  . Lab test positive for detection of COVID-19 virus 09/24/2019  . GBS bacteriuria 09/02/2019  . Marginal insertion of umbilical cord affecting management of mother 06/10/2019  . Supervision of normal first pregnancy 03/13/2019  . Patellofemoral stress syndrome of left knee 08/13/2014  . Exercise-induced asthma 02/06/2012    Assessment: Sheila [name] is a 24 y.o. G1P0000 at [redacted]w[redacted]d here for post dates IOL.  #Labor: Miso x1 given at 1255, FB placed at 1315. Reassess at 1700 #Pain: IV pain meds PRN, epidural upon request. Plans epidural #FWB: Cat I #GBS/ID: Positive by urine culture with amoxicillin allergy, clinda ordered and organism is susceptible #COVID: Positive on 4/16, per protocol no longer requires precautions #MOF: Bottle #MOC: Undecided, LARC as outpatient #Circ: n/a  Venora Maples 10/01/2019, 12:57 PM

## 2019-10-01 NOTE — Progress Notes (Addendum)
Labor Progress Note Sheila [name] is a 24 y.o. G1P0000 at [redacted]w[redacted]d presented for IOL for post dates.   S: Comfortable with epidural.   O:  BP 119/66   Pulse 84   Temp 99 F (37.2 C) (Oral)   Resp 18   Ht 5' 2.5" (1.588 m)   Wt 67.9 kg   LMP 12/29/2018 (Approximate)   SpO2 100%   BMI 26.93 kg/m  EFM:  140, moderate variability, pos accels, no decels, reactive TOCO: q2-76m  CVE: Dilation: 7.5 Effacement (%): 80 Cervical Position: (left side) Station: 0 Presentation: Vertex Exam by:: Jeovani Weisenburger, MD   A&P: 24 y.o. G1P0000 [redacted]w[redacted]d here for IOL for post dates. #Labor: Good cervical change. S/p Cytotec, FB and AROM. Continue amnioinfusion. Adequate MVUs without Pitocin. LOA position. Anticipate SVD. #Pain: Epidural  #FWB: Prolonged decel after exam but otherwise Cat I.  #GBS positive bacteruria, Clindamycin >4hr PTD    Joselyn Arrow, MD 11:35 PM

## 2019-10-01 NOTE — Progress Notes (Signed)
Labor Progress Note Sheila [name] is a 24 y.o. G1P0000 at [redacted]w[redacted]d presented for IOL for post dates.   S: Doing well s/p epidural.    O:  BP 116/67   Pulse 90   Temp 98.4 F (36.9 C) (Oral)   Resp 16   Ht 5' 2.5" (1.588 m)   Wt 67.9 kg   LMP 12/29/2018 (Approximate)   SpO2 99%   BMI 26.93 kg/m  EFM:  140 / moderate variability / variable decelerations TOCO: every 2-4 minutes   CVE: Dilation: 6 Effacement (%): 70 Cervical Position: (left side) Station: 0 Presentation: Vertex Exam by:: Viona Gilmore RN   A&P: 24 y.o. G1P0000 [redacted]w[redacted]d here for IOL for post dates. #Labor:  Unchanged from exam at AROM.  IUPC placed to allow amnioinfusion. #Pain: Epidural  #FWB: Cat 2, moderate variability present, start amnioinfusion for recurrent variable decelerations, good recovery #GBS positive bacteruria, Clindamycin >4hr PTD    Catalina Pizza, MD 7:37 PM

## 2019-10-01 NOTE — Progress Notes (Addendum)
Labor Progress Note Sheila [name] is a 24 y.o. G1P0000 at 108w3dpresented for IOL for post dates.   S: Comfortable. Met patient and discussed plan.   O:  BP 116/65   Pulse 89   Temp 98.4 F (36.9 C) (Oral)   Resp 18   Ht 5' 2.5" (1.588 m)   Wt 67.9 kg   LMP 12/29/2018 (Approximate)   SpO2 100%   BMI 26.93 kg/m  EFM:  140 / moderate variability / variable, late decelerations TOCO: every 2-4 minutes   CVE: Dilation: 5 Effacement (%): 70 Cervical Position: (left side) Station: 0 Presentation: Vertex Exam by:: Armondo Cech, MD   A&P: 24y.o. G1P0000 450w3dere for IOL for post dates. #Labor:  Unchanged from exam at AROM. Cont amnioinfusion. FSE placed due to difficult tracing. MVU's adequate. Due to moderate variability, will cont to observe and give time to make cervical change. Anticipate SVD; CS as appropriate. #Pain: Epidural  #FWB: Cat II; reassuring for variability present #GBS positive bacteruria, Clindamycin >4hr PTD    ChChauncey MannMD 8:35 PM   9:18 PM Addendum: Variables present but improved. MVU's ~ 190 but irregular pattern. Will start Pit. D/w Dr. PeVevelyn Francois 9:43 PM Went bedside for prolonged decel. Pit only on for about 20 minutes but turned off. Several positions tried and now in high-fowlers. HR returned to baseline now. Will cont to monitor at this time and plan to check in 2 hours for cervical change. Dr. PeVevelyn Francoist bedside and aware.

## 2019-10-01 NOTE — Progress Notes (Addendum)
Labor Progress Note KAITLYN FRANKO is a 24 y.o. G1P0000 at [redacted]w[redacted]d presented for IOL for post dates.   S: Doing well s/p epidural.    O:  BP 117/63   Pulse 79   Temp 98.9 F (37.2 C) (Oral)   Resp 18   Ht 5' 2.5" (1.588 m)   Wt 67.9 kg   LMP 12/29/2018 (Approximate)   SpO2 99%   BMI 26.93 kg/m  EFM:  140 / moderate variability / occ early decels  TOCO: every 3-4 minutes   CVE: Dilation: 6 Effacement (%): 70 Cervical Position: (left side) Station: 0 Presentation: Vertex Exam by:: Dr. Crissie Reese   A&P: 24 y.o. G1P0000 [redacted]w[redacted]d here for IOL for post dates. #Labor: Progressing well s/p Cytotec x1. Discussed AROM with patient and patient agreeable.  Successful AROM.  #Pain: Epidural  #FWB: Cat 1  #GBS positive bacteruria, Clindamycin >4hr PTD    Katha Cabal, DO 5:09 PM

## 2019-10-01 NOTE — Anesthesia Preprocedure Evaluation (Signed)
Anesthesia Evaluation  Patient identified by MRN, date of birth, ID band Patient awake    Reviewed: Allergy & Precautions, Patient's Chart, lab work & pertinent test results  Airway Mallampati: II  TM Distance: >3 FB Neck ROM: Full    Dental no notable dental hx.    Pulmonary asthma ,    Pulmonary exam normal breath sounds clear to auscultation       Cardiovascular negative cardio ROS Normal cardiovascular exam Rhythm:Regular Rate:Normal     Neuro/Psych negative neurological ROS  negative psych ROS   GI/Hepatic negative GI ROS, Neg liver ROS,   Endo/Other  negative endocrine ROS  Renal/GU negative Renal ROS     Musculoskeletal negative musculoskeletal ROS (+)   Abdominal   Peds  Hematology negative hematology ROS (+)   Anesthesia Other Findings   Reproductive/Obstetrics (+) Pregnancy                             Anesthesia Physical Anesthesia Plan  ASA: II  Anesthesia Plan: Epidural   Post-op Pain Management:    Induction:   PONV Risk Score and Plan:   Airway Management Planned:   Additional Equipment:   Intra-op Plan:   Post-operative Plan:   Informed Consent: I have reviewed the patients History and Physical, chart, labs and discussed the procedure including the risks, benefits and alternatives for the proposed anesthesia with the patient or authorized representative who has indicated his/her understanding and acceptance.       Plan Discussed with:   Anesthesia Plan Comments:         Anesthesia Quick Evaluation

## 2019-10-02 ENCOUNTER — Encounter (HOSPITAL_COMMUNITY): Payer: Self-pay | Admitting: Obstetrics and Gynecology

## 2019-10-02 ENCOUNTER — Inpatient Hospital Stay (HOSPITAL_COMMUNITY): Payer: BLUE CROSS/BLUE SHIELD

## 2019-10-02 DIAGNOSIS — O99824 Streptococcus B carrier state complicating childbirth: Secondary | ICD-10-CM | POA: Diagnosis not present

## 2019-10-02 DIAGNOSIS — Z8616 Personal history of COVID-19: Secondary | ICD-10-CM

## 2019-10-02 DIAGNOSIS — O480 Post-term pregnancy: Secondary | ICD-10-CM | POA: Diagnosis not present

## 2019-10-02 DIAGNOSIS — Z3A40 40 weeks gestation of pregnancy: Secondary | ICD-10-CM

## 2019-10-02 LAB — RPR: RPR Ser Ql: NONREACTIVE

## 2019-10-02 MED ORDER — PRENATAL MULTIVITAMIN CH
1.0000 | ORAL_TABLET | Freq: Every day | ORAL | Status: DC
  Administered 2019-10-02 – 2019-10-04 (×3): 1 via ORAL
  Filled 2019-10-02 (×3): qty 1

## 2019-10-02 MED ORDER — ACETAMINOPHEN 325 MG PO TABS
650.0000 mg | ORAL_TABLET | ORAL | Status: DC | PRN
  Administered 2019-10-02 – 2019-10-03 (×2): 650 mg via ORAL
  Filled 2019-10-02 (×2): qty 2

## 2019-10-02 MED ORDER — SIMETHICONE 80 MG PO CHEW: 80.0000 mg | CHEWABLE_TABLET | ORAL | Status: DC | PRN

## 2019-10-02 MED ORDER — BENZOCAINE-MENTHOL 20-0.5 % EX AERO
1.0000 "application " | INHALATION_SPRAY | CUTANEOUS | Status: DC | PRN
  Administered 2019-10-02 – 2019-10-04 (×2): 1 via TOPICAL
  Filled 2019-10-02 (×2): qty 56

## 2019-10-02 MED ORDER — ONDANSETRON HCL 4 MG/2ML IJ SOLN: 4.0000 mg | INTRAMUSCULAR | Status: DC | PRN

## 2019-10-02 MED ORDER — SENNOSIDES-DOCUSATE SODIUM 8.6-50 MG PO TABS
2.0000 | ORAL_TABLET | ORAL | Status: DC
  Administered 2019-10-03 (×2): 2 via ORAL
  Filled 2019-10-02 (×2): qty 2

## 2019-10-02 MED ORDER — COCONUT OIL OIL: 1.0000 "application " | TOPICAL_OIL | Status: DC | PRN

## 2019-10-02 MED ORDER — ZOLPIDEM TARTRATE 5 MG PO TABS: 5.0000 mg | ORAL_TABLET | Freq: Every evening | ORAL | Status: DC | PRN

## 2019-10-02 MED ORDER — TETANUS-DIPHTH-ACELL PERTUSSIS 5-2.5-18.5 LF-MCG/0.5 IM SUSP
0.5000 mL | Freq: Once | INTRAMUSCULAR | Status: AC
  Administered 2019-10-03: 17:00:00 0.5 mL via INTRAMUSCULAR
  Filled 2019-10-02: qty 0.5

## 2019-10-02 MED ORDER — DIBUCAINE (PERIANAL) 1 % EX OINT: 1.0000 "application " | TOPICAL_OINTMENT | CUTANEOUS | Status: DC | PRN

## 2019-10-02 MED ORDER — IBUPROFEN 600 MG PO TABS
600.0000 mg | ORAL_TABLET | Freq: Four times a day (QID) | ORAL | Status: DC
  Administered 2019-10-02 – 2019-10-04 (×10): 600 mg via ORAL
  Filled 2019-10-02 (×10): qty 1

## 2019-10-02 MED ORDER — WITCH HAZEL-GLYCERIN EX PADS: 1.0000 "application " | MEDICATED_PAD | CUTANEOUS | Status: DC | PRN

## 2019-10-02 MED ORDER — ONDANSETRON HCL 4 MG PO TABS: 4.0000 mg | ORAL_TABLET | ORAL | Status: DC | PRN

## 2019-10-02 MED ORDER — DIPHENHYDRAMINE HCL 25 MG PO CAPS: 25.0000 mg | ORAL_CAPSULE | Freq: Four times a day (QID) | ORAL | Status: DC | PRN

## 2019-10-02 NOTE — Discharge Summary (Signed)
Postpartum Discharge Summary    Patient Name: Sheila [name] DOB: [birth year] MRN: [id number]  Date of admission: 10/01/2019 Delivering Provider: Alroy Bailiff   Date of discharge: 10/04/2019  Admitting diagnosis: Post-dates pregnancy [O48.0] Intrauterine pregnancy: [redacted]w[redacted]d    Secondary diagnosis:  Active Problems:   Supervision of normal first pregnancy   Marginal insertion of umbilical cord affecting management of mother   GBS bacteriuria   Lab test positive for detection of COVID-19 virus   Post-dates pregnancy  Additional problems: None     Discharge diagnosis: Term Pregnancy Delivered                                                                                                Post partum procedures:None  Augmentation: AROM, Pitocin, Cytotec and Foley Balloon  Complications: None  Hospital course:  Induction of Labor With Vaginal Delivery   24y.o. yo G1P0000 at 428w4das admitted to the hospital 10/01/2019 for induction of labor.  Indication for induction: Postdates.  Patient had an uncomplicated labor course as follows: Initial SVE: Admitted at 1/40/-3. Given cytotec, FB. AROM at 1718 with amnioinfusion. Received epidural. She then progressed to complete.  Membrane Rupture Time/Date: 5:18 PM ,10/01/2019   Intrapartum Procedures: Episiotomy: None [1]                                         Lacerations:  Labial [10]  Patient had delivery of a Viable infant.  Information for the patient's newborn:  [place], Florendo0[[id number]]Delivery Method: Vag-Spont    10/02/2019  Details of delivery can be found in separate delivery note.  Patient had a routine postpartum course. Patient is discharged home 10/04/19. Delivery time: 1:38 AM    Magnesium Sulfate received: No BMZ received: No Rhophylac:No MMR:No Transfusion:No  Physical exam  Vitals:   10/03/19 0523 10/03/19 1425 10/03/19 2206 10/04/19 0500  BP: 103/72 106/65 106/65 114/72  Pulse: 66 69 78 (!) 58  Resp: 18  18 18 18   Temp: 97.7 F (36.5 C) 98 F (36.7 C) 98 F (36.7 C) 97.8 F (36.6 C)  TempSrc:  Oral Oral Oral  SpO2: 100%  100% 100%  Weight:      Height:       General: alert, cooperative and no distress Lochia: appropriate Uterine Fundus: firm Incision: N/A DVT Evaluation: No evidence of DVT seen on physical exam. Labs: Lab Results  Component Value Date   WBC 15.4 (H) 10/01/2019   HGB 11.5 (L) 10/01/2019   HCT 36.4 10/01/2019   MCV 91.5 10/01/2019   PLT 399 10/01/2019   No flowsheet data found. Edinburgh Score: Edinburgh Postnatal Depression Scale Screening Tool 10/02/2019  I have been able to laugh and see the funny side of things. 0  I have looked forward with enjoyment to things. 0  I have blamed myself unnecessarily when things went wrong. 1  I have been anxious or worried for no good reason. 0  I have felt scared  or panicky for no good reason. 0  Things have been getting on top of me. 0  I have been so unhappy that I have had difficulty sleeping. 0  I have felt sad or miserable. 0  I have been so unhappy that I have been crying. 0  The thought of harming myself has occurred to me. 0  Edinburgh Postnatal Depression Scale Total 1    Discharge instruction: per After Visit Summary and "Baby and Me Booklet".  After visit meds:  Allergies as of 10/04/2019      Reactions   Amoxicillin Anaphylaxis      Medication List    TAKE these medications   acetaminophen 500 MG tablet Commonly known as: TYLENOL Take 500 mg by mouth every 6 (six) hours as needed for moderate pain.   AIRBORNE PO Take 1 tablet by mouth daily.   ibuprofen 600 MG tablet Commonly known as: ADVIL Take 1 tablet (600 mg total) by mouth every 6 (six) hours.   PRENATAL VITAMIN PO Take 1 tablet by mouth daily.       Diet: routine diet  Activity: Advance as tolerated. Pelvic rest for 6 weeks.   Outpatient follow up:4 weeks Follow up Appt: Future Appointments  Date Time Provider [place]  11/14/2019  9:30 AM Julianne Handler, [place] [facility]   Follow up Visit: [place] for [place] [facility] Follow up.   Specialty: Obstetrics and Gynecology Why: In 4 weeks for postpartum appointment and IUD placement Contact information: [place], [place] [phone]            Please schedule this patient for Postpartum visit in: 4 weeks with the following provider: Any provider In-Person For C/S patients schedule nurse incision check in weeks 2 weeks: no Low risk pregnancy complicated by: none Delivery mode:  SVD Anticipated Birth Control:  IUD PP Procedures needed: IUD placement  Schedule Integrated [place] visit: no     Newborn Data: Live born female  Birth Weight: 6lb 13oz  APGAR: 23, 9  Newborn Delivery   Birth date/time: 10/02/2019 01:38:00 Delivery type: Vaginal, Spontaneous      Baby Feeding: Breast Disposition:home with mother   10/04/2019 Wende Mott, CNM

## 2019-10-02 NOTE — Anesthesia Postprocedure Evaluation (Signed)
Anesthesia Post Note  Patient: Sheila [name]  Procedure(s) Performed: AN AD HOC LABOR EPIDURAL     Patient location during evaluation: Mother Baby Anesthesia Type: Epidural Level of consciousness: awake and alert Pain management: pain level controlled Vital Signs Assessment: post-procedure vital signs reviewed and stable Respiratory status: spontaneous breathing Cardiovascular status: stable Postop Assessment: no headache, adequate PO intake, no backache, patient able to bend at knees, able to ambulate, epidural receding and no apparent nausea or vomiting Anesthetic complications: no    Last Vitals:  Vitals:   10/02/19 0358 10/02/19 0457  BP: 106/67 109/67  Pulse: 78 87  Resp: 18 18  Temp: 37.1 C 37.1 C  SpO2: 100% 100%    Last Pain:  Vitals:   10/02/19 0459  TempSrc:   PainSc: 0-No pain   Pain Goal: Patients Stated Pain Goal: 7 (10/01/19 1542)                 Jennye Moccasin, [place] Picking

## 2019-10-03 NOTE — Progress Notes (Addendum)
Patient ID: Sheila [name], female   DOB: 01/31/1996, 24 y.o.   MRN: [id number] Post Partum Day 1 Subjective: no complaints, up ad lib, voiding, tolerating PO and + flatus  Objective: Blood pressure 103/72, pulse 66, temperature 97.7 F (36.5 C), resp. rate 18, height 5' 2.5" (1.588 m), weight 67.9 kg, last menstrual period 12/29/2018, SpO2 100 %, unknown if currently breastfeeding.  Physical Exam:  General: alert, cooperative and no distress Lochia: appropriate Uterine Fundus: firm DVT Evaluation: No evidence of DVT seen on physical exam.  Recent Labs    10/01/19 1230  HGB 11.5*  HCT 36.4    Assessment/Plan: Plan for discharge tomorrow and Contraception (undecided for now; interested in [facility] outpatient)   LOS: 2 days   Dorothyann Gibbs 10/03/2019, 6:32 AM

## 2019-10-04 MED ORDER — IBUPROFEN 600 MG PO TABS: 600.0000 mg | ORAL_TABLET | Freq: Four times a day (QID) | ORAL | 0 refills | Status: DC

## 2019-10-04 NOTE — Discharge Instructions (Signed)
Contraception Choices °Contraception, also called birth control, means things to use or ways to try not to get pregnant. °Hormonal birth control °This kind of birth control uses hormones. Here are some types of hormonal birth control: °· A tube that is put under skin of the arm (implant). The tube can stay in for as long as 3 years. °· Shots to get every 3 months (injections). °· Pills to take every day (birth control pills). °· A patch to change 1 time each week for 3 weeks (birth control patch). After that, the patch is taken off for 1 week. °· A ring to put in the vagina. The ring is left in for 3 weeks. Then it is taken out of the vagina for 1 week. Then a new ring is put in. °· Pills to take after unprotected sex (emergency birth control pills). °Barrier birth control °Here are some types of barrier birth control: °· A thin covering that is put on the penis before sex (female condom). The covering is thrown away after sex. °· A soft, loose covering that is put in the vagina before sex (female condom). The covering is thrown away after sex. °· A rubber bowl that sits over the cervix (diaphragm). The bowl must be made for you. The bowl is put into the vagina before sex. The bowl is left in for 6-8 hours after sex. It is taken out within 24 hours. °· A small, soft cup that fits over the cervix (cervical cap). The cup must be made for you. The cup can be left in for 6-8 hours after sex. It is taken out within 48 hours. °· A sponge that is put into the vagina before sex. It must be left in for at least 6 hours after sex. It must be taken out within 30 hours. Then it is thrown away. °· A chemical that kills or stops sperm from getting into the uterus (spermicide). It may be a pill, cream, jelly, or foam to put in the vagina. The chemical should be used at least 10-15 minutes before sex. °IUD (intrauterine) birth control °An IUD is a small, T-shaped piece of plastic. It is put inside the uterus. There are two  kinds: °· Hormone IUD. This kind can stay in for 3-5 years. °· Copper IUD. This kind can stay in for 10 years. °Permanent birth control °Here are some types of permanent birth control: °· Surgery to block the fallopian tubes. °· Having an insert put into each fallopian tube. °· Surgery to tie off the tubes that carry sperm (vasectomy). °Natural planning birth control °Here are some types of natural planning birth control: °· Not having sex on the days the woman could get pregnant. °· Using a calendar: °? To keep track of the length of each period. °? To find out what days pregnancy can happen. °? To plan to not have sex on days when pregnancy can happen. °· Watching for symptoms of ovulation and not having sex during ovulation. One way the woman can check for ovulation is to check her temperature. °· Waiting to have sex until after ovulation. °Summary °· Contraception, also called birth control, means things to use or ways to try not to get pregnant. °· Hormonal methods of birth control include implants, injections, pills, patches, vaginal rings, and emergency birth control pills. °· Barrier methods of birth control can include female condoms, female condoms, diaphragms, cervical caps, sponges, and spermicides. °· There are two types of IUD (intrauterine device) birth control.   An IUD can be put in a woman's uterus to prevent pregnancy for 3-5 years.  Permanent sterilization can be done through a procedure for males, females, or both.  Natural planning methods involve not having sex on the days when the woman could get pregnant. This information is not intended to replace advice given to you by your health care provider. Make sure you discuss any questions you have with your health care provider. Document Revised: 09/11/2018 Document Reviewed: 06/01/2016 Elsevier Patient Education  2020 [employer] Postpartum Care After Vaginal Delivery This sheet gives you information about how to care for yourself from  the time you deliver your baby to up to 6-12 weeks after delivery (postpartum period). Your health care provider may also give you more specific instructions. If you have problems or questions, contact your health care provider. Follow these instructions at home: Vaginal bleeding  It is normal to have vaginal bleeding (lochia) after delivery. Wear a sanitary pad for vaginal bleeding and discharge. ? During the first week after delivery, the amount and appearance of lochia is often similar to a menstrual period. ? Over the next few weeks, it will gradually decrease to a dry, yellow-brown discharge. ? For most women, lochia stops completely by 4-6 weeks after delivery. Vaginal bleeding can vary from woman to woman.  Change your sanitary pads frequently. Watch for any changes in your flow, such as: ? A sudden increase in volume. ? A change in color. ? Large blood clots.  If you pass a blood clot from your vagina, save it and call your health care provider to discuss. Do not flush blood clots down the toilet before talking with your health care provider.  Do not use tampons or douches until your health care provider says this is safe.  If you are not breastfeeding, your period should return 6-8 weeks after delivery. If you are feeding your child breast milk only (exclusive breastfeeding), your period may not return until you stop breastfeeding. Perineal care  Keep the area between the vagina and the anus (perineum) clean and dry as told by your health care provider. Use medicated pads and pain-relieving sprays and creams as directed.  If you had a cut in the perineum (episiotomy) or a tear in the vagina, check the area for signs of infection until you are healed. Check for: ? More redness, swelling, or pain. ? Fluid or blood coming from the cut or tear. ? Warmth. ? Pus or a bad smell.  You may be given a squirt bottle to use instead of wiping to clean the perineum area after you go to the  bathroom. As you start healing, you may use the squirt bottle before wiping yourself. Make sure to wipe gently.  To relieve pain caused by an episiotomy, a tear in the vagina, or swollen veins in the anus (hemorrhoids), try taking a warm sitz bath 2-3 times a day. A sitz bath is a warm water bath that is taken while you are sitting down. The water should only come up to your hips and should cover your buttocks. Breast care  Within the first few days after delivery, your breasts may feel heavy, full, and uncomfortable (breast engorgement). Milk may also leak from your breasts. Your health care provider can suggest ways to help relieve the discomfort. Breast engorgement should go away within a few days.  If you are breastfeeding: ? Wear a bra that supports your breasts and fits you well. ? Keep your nipples clean and  dry. Apply creams and ointments as told by your health care provider. ? You may need to use breast pads to absorb milk that leaks from your breasts. ? You may have uterine contractions every time you breastfeed for up to several weeks after delivery. Uterine contractions help your uterus return to its normal size. ? If you have any problems with breastfeeding, work with your health care provider or Advertising copywriter.  If you are not breastfeeding: ? Avoid touching your breasts a lot. Doing this can make your breasts produce more milk. ? Wear a good-fitting bra and use cold packs to help with swelling. ? Do not squeeze out (express) milk. This causes you to make more milk. Intimacy and sexuality  Ask your health care provider when you can engage in sexual activity. This may depend on: ? Your risk of infection. ? How fast you are healing. ? Your comfort and desire to engage in sexual activity.  You are able to get pregnant after delivery, even if you have not had your period. If desired, talk with your health care provider about methods of birth control  (contraception). Medicines  Take over-the-counter and prescription medicines only as told by your health care provider.  If you were prescribed an antibiotic medicine, take it as told by your health care provider. Do not stop taking the antibiotic even if you start to feel better. Activity  Gradually return to your normal activities as told by your health care provider. Ask your health care provider what activities are safe for you.  Rest as much as possible. Try to rest or take a nap while your baby is sleeping. Eating and drinking   Drink enough fluid to keep your urine pale yellow.  Eat high-fiber foods every day. These may help prevent or relieve constipation. High-fiber foods include: ? Whole grain cereals and breads. ? Brown rice. ? Beans. ? Fresh fruits and vegetables.  Do not try to lose weight quickly by cutting back on calories.  Take your prenatal vitamins until your postpartum checkup or until your health care provider tells you it is okay to stop. Lifestyle  Do not use any products that contain nicotine or tobacco, such as cigarettes and e-cigarettes. If you need help quitting, ask your health care provider.  Do not drink alcohol, especially if you are breastfeeding. General instructions  Keep all follow-up visits for you and your baby as told by your health care provider. Most women visit their health care provider for a postpartum checkup within the first 3-6 weeks after delivery. Contact a health care provider if:  You feel unable to cope with the changes that your child brings to your life, and these feelings do not go away.  You feel unusually sad or worried.  Your breasts become red, painful, or hard.  You have a fever.  You have trouble holding urine or keeping urine from leaking.  You have little or no interest in activities you used to enjoy.  You have not breastfed at all and you have not had a menstrual period for 12 weeks after delivery.  You  have stopped breastfeeding and you have not had a menstrual period for 12 weeks after you stopped breastfeeding.  You have questions about caring for yourself or your baby.  You pass a blood clot from your vagina. Get help right away if:  You have chest pain.  You have difficulty breathing.  You have sudden, severe leg pain.  You have severe pain or  cramping in your lower abdomen.  You bleed from your vagina so much that you fill more than one sanitary pad in one hour. Bleeding should not be heavier than your heaviest period.  You develop a severe headache.  You faint.  You have blurred vision or spots in your vision.  You have bad-smelling vaginal discharge.  You have thoughts about hurting yourself or your baby. If you ever feel like you may hurt yourself or others, or have thoughts about taking your own life, get help right away. You can go to the nearest emergency department or call:  Your local emergency services (911 in the [place]  A suicide crisis helpline, such as the [place] at 540 590 8664. This is open 24 hours a day. Summary  The period of time right after you deliver your newborn up to 6-12 weeks after delivery is called the postpartum period.  Gradually return to your normal activities as told by your health care provider.  Keep all follow-up visits for you and your baby as told by your health care provider. This information is not intended to replace advice given to you by your health care provider. Make sure you discuss any questions you have with your health care provider. Document Revised: 05/25/2017 Document Reviewed: 03/05/2017 Elsevier Patient Education  2020 [employer].

## 2019-10-14 ENCOUNTER — Encounter: Payer: Self-pay | Admitting: Women's Health

## 2019-10-14 ENCOUNTER — Ambulatory Visit (INDEPENDENT_AMBULATORY_CARE_PROVIDER_SITE_OTHER): Payer: BLUE CROSS/BLUE SHIELD | Admitting: Women's Health

## 2019-10-14 ENCOUNTER — Other Ambulatory Visit: Payer: Self-pay

## 2019-10-14 VITALS — BP 112/78 | HR 78 | Ht 62.0 in | Wt 126.0 lb

## 2019-10-14 DIAGNOSIS — N898 Other specified noninflammatory disorders of vagina: Secondary | ICD-10-CM

## 2019-10-14 DIAGNOSIS — R300 Dysuria: Secondary | ICD-10-CM | POA: Diagnosis not present

## 2019-10-14 NOTE — Progress Notes (Signed)
  History:  [name] is a 24 y.o. G1P1001 who presents to clinic today for urethral irritation x3 days. Patient reports she had a catheter inserted while she had an epidural, but then she could not urinate after the initial delivery so she had to be re-cathed and this was left in place for just a single episode of urination and then was removed. Patient denies difficulties with urination at this time. Patient reports irritation will occur when she is sitting and reports it might be from her tear that she had during labor. Patient reports the feeling as discomfort and that it is intermittent. Patient reports it is relieved with ibuprofen. Patient is 11 days ppartum and endorses vaginal delivery with right labial tear. Patient denies dysuria, frequency, urgency.  The following portions of the patient's history were reviewed and updated as appropriate: allergies, current medications, family history, past medical history, social history, past surgical history and problem list.  Review of Systems:  Review of Systems  Constitutional: Negative for chills and fever.  Respiratory: Negative for shortness of breath.   Cardiovascular: Negative for chest pain.  Gastrointestinal: Negative for abdominal pain, nausea and vomiting.  Genitourinary: Negative for dysuria, frequency and urgency.  Neurological: Negative for headaches.     Objective:  Physical Exam BP 112/78   Pulse 78   Ht 5\' 2"  (1.575 m)   Wt 126 lb (57.2 kg)   LMP 12/29/2018 (Approximate)   Breastfeeding No   BMI 23.05 kg/m  Physical Exam  Constitutional: She is oriented to person, place, and time. She appears well-developed and well-nourished. No distress.  HENT:  Head: Normocephalic and atraumatic.  Respiratory: Effort normal.  GI: Soft.  Genitourinary:    There is no rash, tenderness or lesion on the right labia. There is no rash, tenderness or lesion on the left labia.    Genitourinary Comments: Stitch noted next to  urethra and next to vaginal opening on right hand side. Patient endorses these areas as areas of irritation. No erythema, swelling, warmth, drainage, bleeding or other s/sx of infection noted.   Neurological: She is alert and oriented to person, place, and time.  Skin: Skin is warm and dry. She is not diaphoretic.  Psychiatric: She has a normal mood and affect. Her behavior is normal. Judgment and thought content normal.   Labs and Imaging No results found for this or any previous visit (from the past 24 hour(s)).  No results found.   Assessment & Plan:   1. Vaginal irritation - discussed normal expectations for healing after delivery/tear and s/sx of infection - reassurance given of normal healing process - discussed comfort measures for relief of irritation - Urine Culture  Approximately 10 minutes of face-to-face time was spent with this patient   Nathalya Wolanski, 12/31/2018, NP 10/14/2019 3:55 PM

## 2019-10-14 NOTE — Patient Instructions (Signed)
Care of a Perineal Tear A perineal tear is a cut or tear (laceration) in the tissue between the opening of the vagina and the anus (perineum). Some women develop a perineal tear during a vaginal birth. This can happen as the baby emerges from the birth canal and the perineum is stretched. There are four degrees of perineal tears based on how deep and long the laceration is:  First degree. This involves a shallow tear at the edge of the vaginal opening that extends slightly into the perineal skin.  Second degree. This involves tearing described in first degree perineal tear, and an additional deeper tear of the vaginal opening and perineal tissues. It may also include tearing of a muscle just under the perineal skin.  Third degree. This involves tearing described in first and second degree perineal tears, with the addition that tearing in the third degree extends into the muscle of the anus (anal sphincter).  Fourth degree. This involves all levels of tears described in first, second, and third degree perineal tears, with the tear in the fourth degree extending into the rectum. First and second degree perineal tears may or may not be stitched closed, depending on their location and appearance. Third and fourth degree perineal tears are stitched closed immediately after the baby's birth. What are the risks? Depending on the type of perineal tear you have, you may be at risk for:  Bleeding.  Developing a collection of blood in the perineal tear area (hematoma).  Pain. This may include pain when you urinate, or pain when you have a bowel movement.  Infection at the site of the tear.  Fever.  Trouble controlling your urination or bowels (incontinence).  Painful sex. How to care for a perineal tear Wound care  Take a sitz bath as told by your health care provider. A sitz bath is a warm water bath that is taken while you are sitting down. The water should only come up to your hips and should  cover your buttocks. This can speed up healing. 1. Partially fill a bathtub with warm water. You will only need the water to be deep enough to cover your hips and buttocks when you are sitting in it. 2. If your health care provider told you to put medicine in the water, follow the directions exactly as told. 3. Sit in the water and open the tub drain a little. 4. Turn on the warm water again to keep the tub at the correct level. Keep the water running constantly. 5. Soak in the water for 15-20 minutes or as told by your health care provider. 6. After the sitz bath, pat the affected area dry first. Do not rub it. 7. Be careful when you stand up after the sitz bath because you may feel dizzy.  Wash your hands before and after applying medicine to the area.  Wear a sanitary pad as told by your health care provider. Change the pad as often as told by your health care provider.  Leave stitches (sutures), skin glue, or adhesive strips in place. These skin closures may need to stay in place for 2 weeks or longer. If adhesive strip edges start to loosen and curl up, you may trim the loose edges. Do not remove adhesive strips completely unless your health care provider tells you to do that.  Check your wound every day for signs of infection. Check for: ? Redness, swelling, or pain. ? Fluid or blood. ? Warmth. ? Pus or a bad   smell. Managing pain  If directed, put ice on the painful area: ? Put ice in a plastic bag. ? Place a towel between your skin and the bag. ? Leave the ice on for 20 minutes, 2-3 times a day.  Apply a numbing spray to the perineal tear site as told by your health care provider. This may help with discomfort.  Take and apply over-the-counter and prescription medicines only as told by your health care provider.  If told, put about 3 witch hazel-containing hemorrhoid treatment pads on top of your sanitary pad. The witch hazel in the hemorrhoid pads helps with swelling and  discomfort.  Sit on an inflatable ring or pillow. This may provide comfort. General instructions  Squeeze warm water on your perineum after urinating. This should be done from front to back with a squeeze bottle. Pat the area to dry it.  Do not have sex, use tampons, or place anything in your vagina for at least 6 weeks or as told by your health care provider.  Keep all follow-up visits as told by your health care provider. These include any postpartum visits. This is important. Contact a health care provider if:  Your pain is not relieved with medicines.  You have painful urination.  You have redness, swelling, or pain around your tear.  You have fluid or blood coming from your tear.  Your tear feels warm to the touch.  You have pus or a bad smell coming from your tear.  You have a fever. Get help right away if:  Your tear opens.  You cannot urinate.  You have an increase in bleeding.  You have severe pain. Summary  A perineal tear is a cut or tear (laceration) in the tissue between the opening of the vagina and the anus (perineum).  There are four degrees of perineal tears based on how deep and long the laceration is.  First and second-degree perineal tears may or may not be stitched closed, depending on their location and appearance. Third and fourth- degree perineal tears are stitched closed immediately after the baby's birth.  Follow your health care provider's instructions for caring for your perineal tear. Know how to manage pain and how to care for your wound. Know when to call your health care provider and when to seek immediate emergency care. This information is not intended to replace advice given to you by your health care provider. Make sure you discuss any questions you have with your health care provider. Document Revised: 05/04/2017 Document Reviewed: 06/26/2016 Elsevier Patient Education  2020 [employer]  Postpartum Care After Vaginal  Delivery This sheet gives you information about how to care for yourself from the time you deliver your baby to up to 6-12 weeks after delivery (postpartum period). Your health care provider may also give you more specific instructions. If you have problems or questions, contact your health care provider. Follow these instructions at home: Vaginal bleeding  It is normal to have vaginal bleeding (lochia) after delivery. Wear a sanitary pad for vaginal bleeding and discharge. ? During the first week after delivery, the amount and appearance of lochia is often similar to a menstrual period. ? Over the next few weeks, it will gradually decrease to a dry, yellow-brown discharge. ? For most women, lochia stops completely by 4-6 weeks after delivery. Vaginal bleeding can vary from woman to woman.  Change your sanitary pads frequently. Watch for any changes in your flow, such as: ? A sudden increase in volume. ?   A change in color. ? Large blood clots.  If you pass a blood clot from your vagina, save it and call your health care provider to discuss. Do not flush blood clots down the toilet before talking with your health care provider.  Do not use tampons or douches until your health care provider says this is safe.  If you are not breastfeeding, your period should return 6-8 weeks after delivery. If you are feeding your child breast milk only (exclusive breastfeeding), your period may not return until you stop breastfeeding. Perineal care  Keep the area between the vagina and the anus (perineum) clean and dry as told by your health care provider. Use medicated pads and pain-relieving sprays and creams as directed.  If you had a cut in the perineum (episiotomy) or a tear in the vagina, check the area for signs of infection until you are healed. Check for: ? More redness, swelling, or pain. ? Fluid or blood coming from the cut or tear. ? Warmth. ? Pus or a bad smell.  You may be given a squirt  bottle to use instead of wiping to clean the perineum area after you go to the bathroom. As you start healing, you may use the squirt bottle before wiping yourself. Make sure to wipe gently.  To relieve pain caused by an episiotomy, a tear in the vagina, or swollen veins in the anus (hemorrhoids), try taking a warm sitz bath 2-3 times a day. A sitz bath is a warm water bath that is taken while you are sitting down. The water should only come up to your hips and should cover your buttocks. Breast care  Within the first few days after delivery, your breasts may feel heavy, full, and uncomfortable (breast engorgement). Milk may also leak from your breasts. Your health care provider can suggest ways to help relieve the discomfort. Breast engorgement should go away within a few days.  If you are breastfeeding: ? Wear a bra that supports your breasts and fits you well. ? Keep your nipples clean and dry. Apply creams and ointments as told by your health care provider. ? You may need to use breast pads to absorb milk that leaks from your breasts. ? You may have uterine contractions every time you breastfeed for up to several weeks after delivery. Uterine contractions help your uterus return to its normal size. ? If you have any problems with breastfeeding, work with your health care provider or lactation consultant.  If you are not breastfeeding: ? Avoid touching your breasts a lot. Doing this can make your breasts produce more milk. ? Wear a good-fitting bra and use cold packs to help with swelling. ? Do not squeeze out (express) milk. This causes you to make more milk. Intimacy and sexuality  Ask your health care provider when you can engage in sexual activity. This may depend on: ? Your risk of infection. ? How fast you are healing. ? Your comfort and desire to engage in sexual activity.  You are able to get pregnant after delivery, even if you have not had your period. If desired, talk with your  health care provider about methods of birth control (contraception). Medicines  Take over-the-counter and prescription medicines only as told by your health care provider.  If you were prescribed an antibiotic medicine, take it as told by your health care provider. Do not stop taking the antibiotic even if you start to feel better. Activity  Gradually return to your normal activities   as told by your health care provider. Ask your health care provider what activities are safe for you.  Rest as much as possible. Try to rest or take a nap while your baby is sleeping. Eating and drinking   Drink enough fluid to keep your urine pale yellow.  Eat high-fiber foods every day. These may help prevent or relieve constipation. High-fiber foods include: ? Whole grain cereals and breads. ? Brown rice. ? Beans. ? Fresh fruits and vegetables.  Do not try to lose weight quickly by cutting back on calories.  Take your prenatal vitamins until your postpartum checkup or until your health care provider tells you it is okay to stop. Lifestyle  Do not use any products that contain nicotine or tobacco, such as cigarettes and e-cigarettes. If you need help quitting, ask your health care provider.  Do not drink alcohol, especially if you are breastfeeding. General instructions  Keep all follow-up visits for you and your baby as told by your health care provider. Most women visit their health care provider for a postpartum checkup within the first 3-6 weeks after delivery. Contact a health care provider if:  You feel unable to cope with the changes that your child brings to your life, and these feelings do not go away.  You feel unusually sad or worried.  Your breasts become red, painful, or hard.  You have a fever.  You have trouble holding urine or keeping urine from leaking.  You have little or no interest in activities you used to enjoy.  You have not breastfed at all and you have not had a  menstrual period for 12 weeks after delivery.  You have stopped breastfeeding and you have not had a menstrual period for 12 weeks after you stopped breastfeeding.  You have questions about caring for yourself or your baby.  You pass a blood clot from your vagina. Get help right away if:  You have chest pain.  You have difficulty breathing.  You have sudden, severe leg pain.  You have severe pain or cramping in your lower abdomen.  You bleed from your vagina so much that you fill more than one sanitary pad in one hour. Bleeding should not be heavier than your heaviest period.  You develop a severe headache.  You faint.  You have blurred vision or spots in your vision.  You have bad-smelling vaginal discharge.  You have thoughts about hurting yourself or your baby. If you ever feel like you may hurt yourself or others, or have thoughts about taking your own life, get help right away. You can go to the nearest emergency department or call:  Your local emergency services (911 in the [place]  A suicide crisis helpline, such as the National Suicide Prevention Lifeline at 1-800-273-8255. This is open 24 hours a day. Summary  The period of time right after you deliver your newborn up to 6-12 weeks after delivery is called the postpartum period.  Gradually return to your normal activities as told by your health care provider.  Keep all follow-up visits for you and your baby as told by your health care provider. This information is not intended to replace advice given to you by your health care provider. Make sure you discuss any questions you have with your health care provider. Document Revised: 05/25/2017 Document Reviewed: 03/05/2017 Elsevier Patient Education  2020 Elsevier Inc.  

## 2019-10-16 LAB — URINE CULTURE
MICRO NUMBER:: 10464700
SPECIMEN QUALITY:: ADEQUATE

## 2019-10-30 ENCOUNTER — Other Ambulatory Visit: Payer: Self-pay

## 2019-10-30 ENCOUNTER — Ambulatory Visit (INDEPENDENT_AMBULATORY_CARE_PROVIDER_SITE_OTHER): Payer: BLUE CROSS/BLUE SHIELD | Admitting: Obstetrics & Gynecology

## 2019-10-30 DIAGNOSIS — Z30430 Encounter for insertion of intrauterine contraceptive device: Secondary | ICD-10-CM | POA: Diagnosis not present

## 2019-10-30 LAB — POCT URINE PREGNANCY: Preg Test, Ur: NEGATIVE

## 2019-10-30 MED ORDER — LEVONORGESTREL 19.5 MCG/DAY IU IUD: INTRAUTERINE_SYSTEM | Freq: Once | INTRAUTERINE | Status: DC

## 2019-10-30 MED ORDER — LEVONORGESTREL 20 MCG/24HR IU IUD: INTRAUTERINE_SYSTEM | Freq: Once | INTRAUTERINE | Status: AC

## 2019-10-30 NOTE — Progress Notes (Signed)
[facility] Partum Visit Note  Sheila [name] is a 24 y.o. G76P1001 female who presents for a postpartum visit. She is 4 weeks postpartum following an IOL for post dates vaginal delivery.  I have fully reviewed the prenatal and intrapartum course. The delivery was at [redacted]w[redacted]d gestational weeks.  Anesthesia: epidural. Postpartum course has been unremarkable but did have a Rt labial tear. Baby is doing well. Baby is feeding by bottle - Gerber Gentle. Bleeding no bleeding. Bowel function is normal. Bladder function is normal. Patient is not sexually active. Desired contraception method is IUD. Postpartum depression screening: negative.  The following portions of the patient's history were reviewed and updated as appropriate: allergies, current medications, past family history, past medical history, past social history, past surgical history and problem list.  Review of Systems Pertinent items noted in HPI and remainder of comprehensive ROS otherwise negative.    Objective:  Blood pressure 104/66, pulse 69, height 5\' 4"  (1.626 m), weight 121 lb (54.9 kg), last menstrual period 12/29/2018, not currently breastfeeding.  General:  alert and no distress   Breasts:  deferred  Lungs: clear to auscultation bilaterally  Heart:  regular rate and rhythm  Abdomen: soft, non-tender; bowel sounds normal; no masses,  no organomegaly   Vulva:  normal and well-healing right labial laceration. Extruding suture material excised  Vagina: normal vagina, no discharge, exudate, lesion, or erythema  Cervix:  multiparous appearance  Corpus: normal size, contour, position, consistency, mobility, non-tender  Adnexa:  not evaluated  Rectal Exam: Not performed.       IUD Insertion Procedure Note Patient identified, informed consent performed, consent signed.   Discussed risks of irregular bleeding, cramping, infection, malpositioning or misplacement of the IUD outside the uterus which may require further procedure such as  laparoscopy. Also discussed >99% contraception efficacy, increased risk of ectopic pregnancy with failure of method.  Time out was performed.  Urine pregnancy test negative.  Speculum placed in the vagina.  Cervix visualized.  Cleaned with Betadine x 2.  Grasped anteriorly with a single tooth tenaculum.  Uterus sounded to 9 cm.  Mirena IUD placed per manufacturer's recommendations.  Strings trimmed to 3 cm. Tenaculum was removed, good hemostasis noted.  Patient tolerated procedure well.   Patient was given post-procedure instructions.  She was advised to have backup contraception for one week.  Patient was also asked to check IUD strings periodically and follow up in 4 weeks for IUD check.    Assessment:    Normal postpartum exam. Mirena placed at today's visit.   Plan:   Essential components of care per ACOG recommendations:  1.  Mood and well being: Patient with negative depression screening today. Reviewed local resources for support.  - Patient does not use tobacco and no history of drug use.   2. Infant care and feeding:  -Patient currently breastmilk feeding? No  -Social determinants of health (SDOH) reviewed in EPIC. No concerns.  3. Sexuality, contraception and birth spacing - Patient does not want a pregnancy in the next year.  Desired family size is 2 children.  - Reviewed forms of contraception in tiered fashion. Patient desired IUD which was placed today. Can in place for up to seven years.    - Discussed birth spacing of 18 months  4. Sleep and fatigue -Encouraged family/partner/community support of 4 hrs of uninterrupted sleep to help with mood and fatigue  5. Physical Recovery  - Discussed patients delivery - Patient had a right labial laceration, healing  reviewed. Patient expressed understanding - Patient has urinary incontinence? No - Patient is safe to resume physical and sexual activity  6.  Health Maintenance - Last pap smear done 03/14/2019 and was normal      Jaynie Collins, MD [facility], [facility]

## 2019-10-30 NOTE — Patient Instructions (Signed)
Intrauterine Device Insertion, Care After  This sheet gives you information about how to care for yourself after your procedure. Your health care provider may also give you more specific instructions. If you have problems or questions, contact your health care provider. What can I expect after the procedure? After the procedure, it is common to have:  Cramps and pain in the abdomen.  Light bleeding (spotting) or heavier bleeding that is like your menstrual period. This may last for up to a few days.  Lower back pain.  Dizziness.  Headaches.  Nausea. Follow these instructions at home:  Before resuming sexual activity, check to make sure that you can feel the IUD string(s). You should be able to feel the end of the string(s) below the opening of your cervix. If your IUD string is in place, you may resume sexual activity. ? If you had a hormonal IUD inserted more than 7 days after your most recent period started, you will need to use a backup method of birth control for 7 days after IUD insertion. Ask your health care provider whether this applies to you.  Continue to check that the IUD is still in place by feeling for the string(s) after every menstrual period, or once a month.  Take over-the-counter and prescription medicines only as told by your health care provider.  Do not drive or use heavy machinery while taking prescription pain medicine.  Keep all follow-up visits as told by your health care provider. This is important. Contact a health care provider if:  You have bleeding that is heavier or lasts longer than a normal menstrual cycle.  You have a fever.  You have cramps or abdominal pain that get worse or do not get better with medicine.  You develop abdominal pain that is new or is not in the same area of earlier cramping and pain.  You feel lightheaded or weak.  You have abnormal or bad-smelling discharge from your vagina.  You have pain during sexual  activity.  You have any of the following problems with your IUD string(s): ? The string bothers or hurts you or your sexual partner. ? You cannot feel the string. ? The string has gotten longer.  You can feel the IUD in your vagina.  You think you may be pregnant, or you miss your menstrual period.  You think you may have an STI (sexually transmitted infection). Get help right away if:  You have flu-like symptoms.  You have a fever and chills.  You can feel that your IUD has slipped out of place. Summary  After the procedure, it is common to have cramps and pain in the abdomen. It is also common to have light bleeding (spotting) or heavier bleeding that is like your menstrual period.  Continue to check that the IUD is still in place by feeling for the string(s) after every menstrual period, or once a month.  Keep all follow-up visits as told by your health care provider. This is important.  Contact your health care provider if you have problems with your IUD string(s), such as the string getting longer or bothering you or your sexual partner. This information is not intended to replace advice given to you by your health care provider. Make sure you discuss any questions you have with your health care provider. Document Revised: 05/04/2017 Document Reviewed: 04/12/2016 Elsevier Patient Education  2020 Elsevier Inc.  

## 2019-11-14 ENCOUNTER — Ambulatory Visit: Payer: BLUE CROSS/BLUE SHIELD | Admitting: Certified Nurse Midwife

## 2019-12-02 ENCOUNTER — Ambulatory Visit (INDEPENDENT_AMBULATORY_CARE_PROVIDER_SITE_OTHER): Payer: BLUE CROSS/BLUE SHIELD | Admitting: Student

## 2019-12-02 ENCOUNTER — Other Ambulatory Visit: Payer: Self-pay

## 2019-12-02 ENCOUNTER — Encounter: Payer: Self-pay | Admitting: Student

## 2019-12-02 VITALS — BP 108/68 | HR 65 | Wt 117.0 lb

## 2019-12-02 DIAGNOSIS — Z30431 Encounter for routine checking of intrauterine contraceptive device: Secondary | ICD-10-CM | POA: Diagnosis not present

## 2019-12-02 DIAGNOSIS — Z975 Presence of (intrauterine) contraceptive device: Secondary | ICD-10-CM

## 2019-12-02 NOTE — Progress Notes (Signed)
  History:  [name] is a 24 y.o. G1P1001 who presents to clinic today for string check. She denies any complaints; vaginal bleeding is light and she has no pain. Pap UTD, October 2020.   The following portions of the patient's history were reviewed and updated as appropriate: allergies, current medications, family history, past medical history, social history, past surgical history and problem list.  Review of Systems:  Review of Systems  Constitutional: Negative.   HENT: Negative.   Cardiovascular: Negative.   Genitourinary: Negative.   Musculoskeletal: Negative.   Skin: Negative.   Neurological: Negative.   Positive for vaginal bleeding    Objective:  Physical Exam BP 108/68   Pulse 65   Wt 117 lb (53.1 kg)   Breastfeeding No   BMI 20.08 kg/m  Physical Exam Constitutional:      Appearance: Normal appearance.  HENT:     Head: Normocephalic.  Pulmonary:     Effort: Pulmonary effort is normal.  Abdominal:     General: Abdomen is flat.  Genitourinary:    Comments: NEFG; strings visible at the introitus; trace amount of blood. No CMT, suprapubic tenderness.  Musculoskeletal:     Cervical back: Normal range of motion.  Neurological:     Mental Status: She is alert.       Labs and Imaging No results found for this or any previous visit (from the past 24 hour(s)).  No results found.   Assessment & Plan:   1. IUD (intrauterine device) in place   -String in place; no other complaints.  Approximately 30 minutes of face-to-face time was spent with this patient.   Marylene Land, CNM 12/02/2019 7:15 PM

## 2020-01-07 ENCOUNTER — Other Ambulatory Visit: Payer: Self-pay

## 2020-01-07 ENCOUNTER — Ambulatory Visit (INDEPENDENT_AMBULATORY_CARE_PROVIDER_SITE_OTHER): Payer: BLUE CROSS/BLUE SHIELD | Admitting: Obstetrics & Gynecology

## 2020-01-07 ENCOUNTER — Other Ambulatory Visit (HOSPITAL_COMMUNITY)
Admission: RE | Admit: 2020-01-07 | Discharge: 2020-01-07 | Disposition: A | Discharge status: Home / Self Care | Payer: BLUE CROSS/BLUE SHIELD | Source: Ambulatory Visit | Attending: Obstetrics & Gynecology | Admitting: Obstetrics & Gynecology

## 2020-01-07 ENCOUNTER — Encounter: Payer: Self-pay | Admitting: Obstetrics & Gynecology

## 2020-01-07 VITALS — BP 94/62 | HR 60 | Resp 16 | Ht 63.0 in | Wt 113.0 lb

## 2020-01-07 DIAGNOSIS — N898 Other specified noninflammatory disorders of vagina: Secondary | ICD-10-CM

## 2020-01-07 DIAGNOSIS — Z30431 Encounter for routine checking of intrauterine contraceptive device: Secondary | ICD-10-CM

## 2020-01-07 MED ORDER — MEDROXYPROGESTERONE ACETATE 10 MG PO TABS: 10.0000 mg | ORAL_TABLET | Freq: Every day | ORAL | 0 refills | Status: DC

## 2020-01-09 LAB — CERVICOVAGINAL ANCILLARY ONLY
Bacterial Vaginitis (gardnerella): POSITIVE — AB
Candida Glabrata: NEGATIVE
Candida Vaginitis: NEGATIVE
Chlamydia: NEGATIVE
Comment: NEGATIVE
Comment: NEGATIVE
Comment: NEGATIVE
Comment: NEGATIVE
Comment: NEGATIVE
Comment: NORMAL
Neisseria Gonorrhea: NEGATIVE
Trichomonas: NEGATIVE

## 2020-01-09 MED ORDER — METRONIDAZOLE 500 MG PO TABS
500.0000 mg | ORAL_TABLET | Freq: Two times a day (BID) | ORAL | 0 refills | Status: DC
Start: 2020-01-09 — End: 2020-08-03

## 2020-01-09 NOTE — Progress Notes (Signed)
° °  Subjective:    Patient ID: Sheila [name], female    DOB: [birth year], 24 y.o.   MRN: [id number]  HPI  24 yo female presents with symptoms of BV.  No abdominal pain.  Still having some bleeding with after IUD placed.    Review of Systems  Constitutional: Negative.   Respiratory: Negative.   Cardiovascular: Negative.   Gastrointestinal: Negative.   Genitourinary: Negative.        Objective:   Physical Exam Vitals reviewed.  Constitutional:      General: She is not in acute distress.    Appearance: She is well-developed.  HENT:     Head: Normocephalic and atraumatic.  Eyes:     Conjunctiva/sclera: Conjunctivae normal.  Cardiovascular:     Rate and Rhythm: Normal rate.  Pulmonary:     Effort: Pulmonary effort is normal.  Abdominal:     General: Abdomen is flat.     Palpations: Abdomen is soft.  Genitourinary:    General: Normal vulva.     Vagina: Vaginal discharge present.     Comments: Cervix--IUD strings seen, No lesions. Skin:    General: Skin is warm and dry.  Neurological:     Mental Status: She is alert and oriented to person, place, and time.    Vitals:   01/07/20 1325  BP: 94/62  Pulse: 60  Resp: 16  Weight: 113 lb (51.3 kg)  Height: 5\' 3"  (1.6 m)      Assessment & Plan:  24 yo female with vaginal discharge and bleeding after prog IUD placed.  1.  aptima--returned positive for BV; Rx with Flagyl 2.  Provera to help with bleeding.

## 2020-02-16 ENCOUNTER — Ambulatory Visit: Payer: BLUE CROSS/BLUE SHIELD | Admitting: Obstetrics & Gynecology

## 2020-02-22 IMAGING — US US MFM OB FOLLOW-UP
1 series · 13 of 28 positions shown · non-contrast
Comparison: none

[Series 1: us mfm ob follow-up · 13 of 44 slices shown]
[im 2/44]
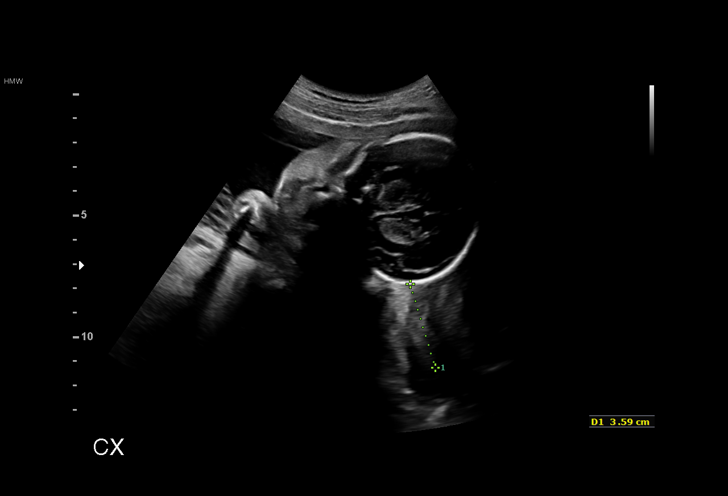
[im 5/44]
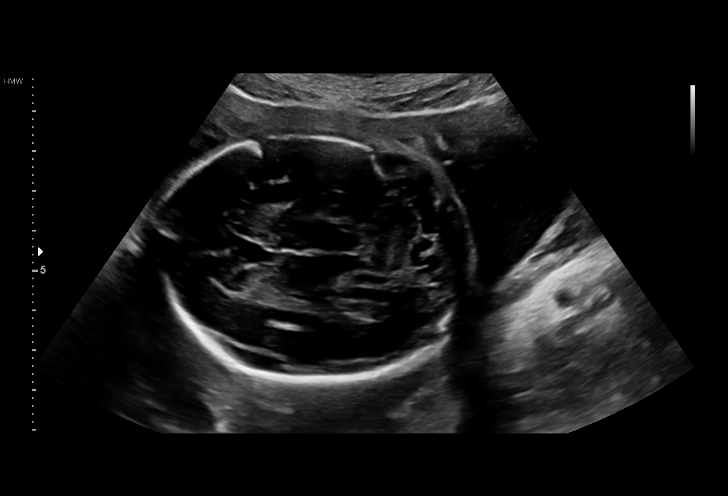
[im 8/44]
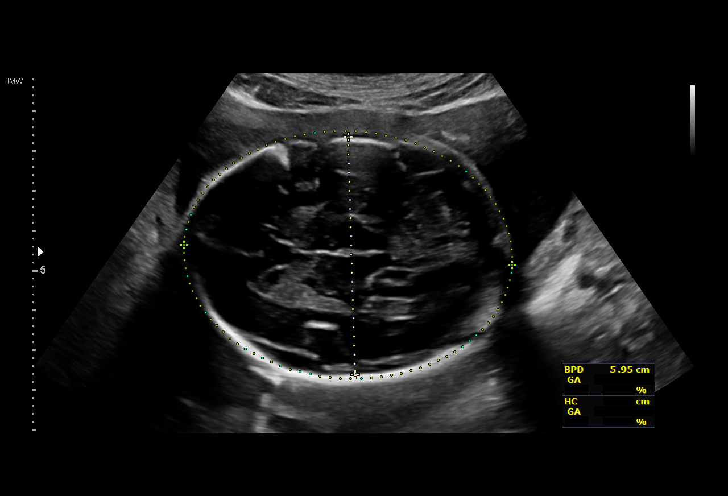
[im 12/44]
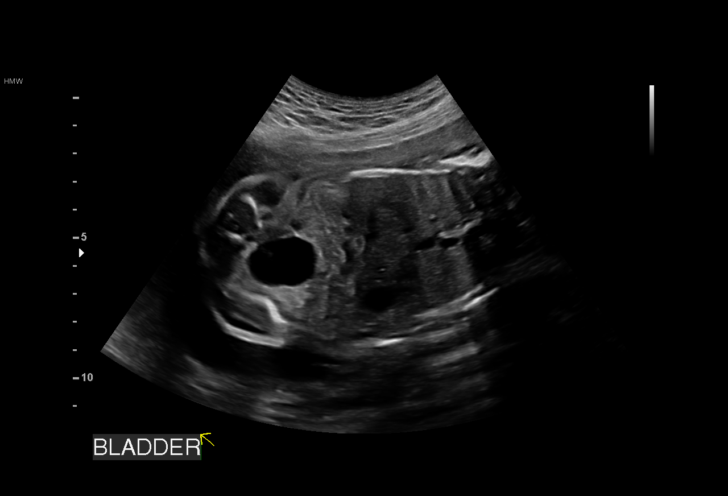
[im 15/44]
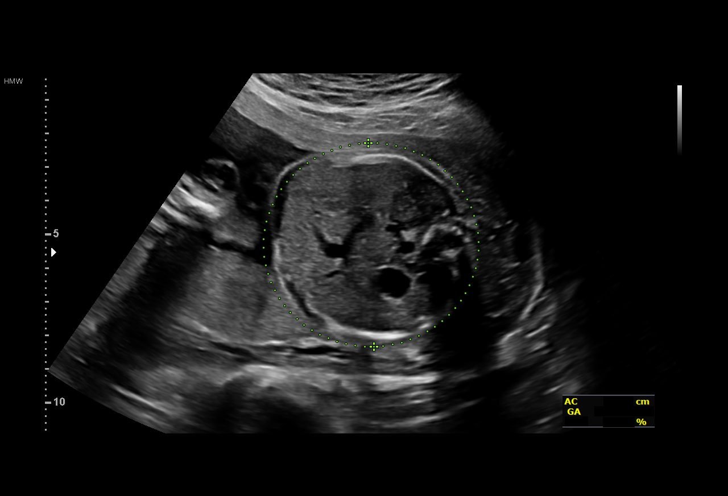
[im 18/44]
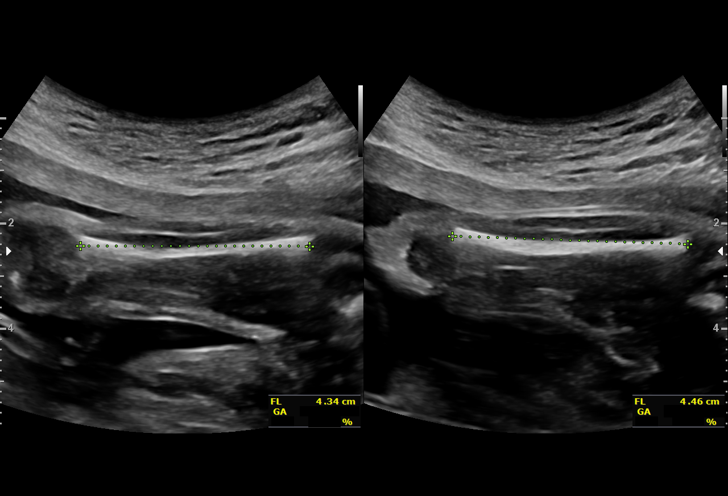
[im 23/44]
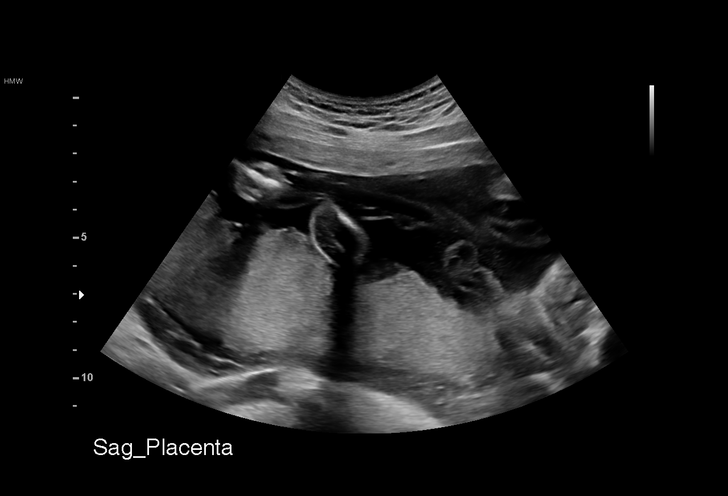
[im 26/44]
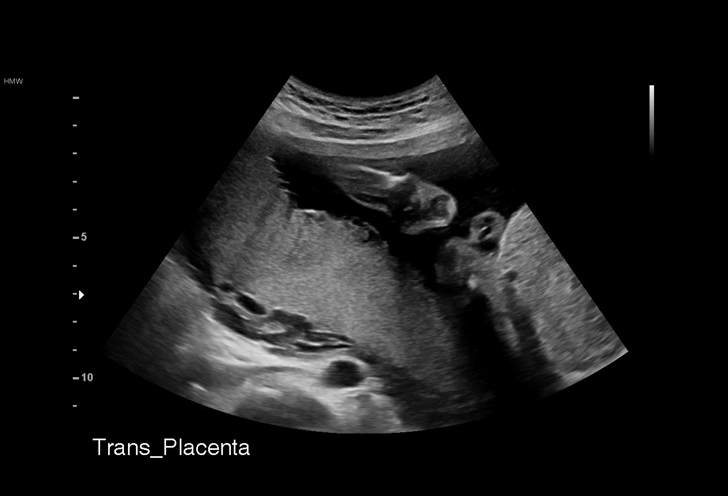
[im 29/44]
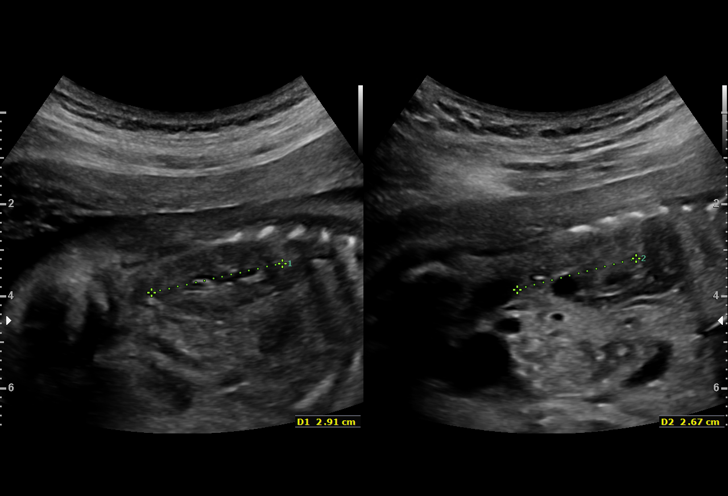
[im 32/44]
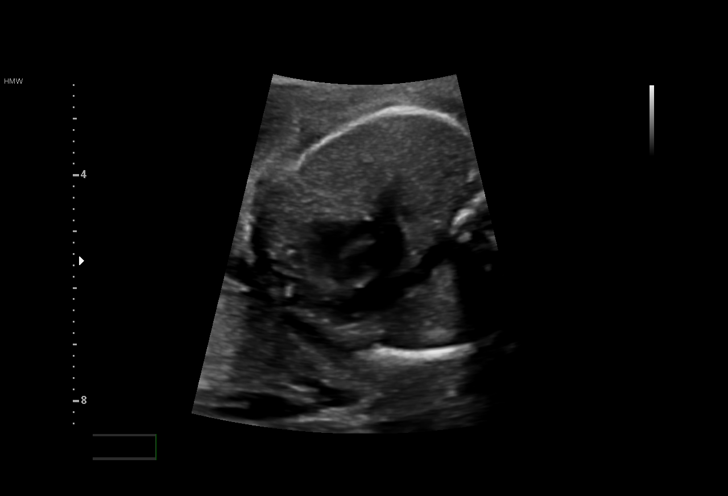
[im 36/44]
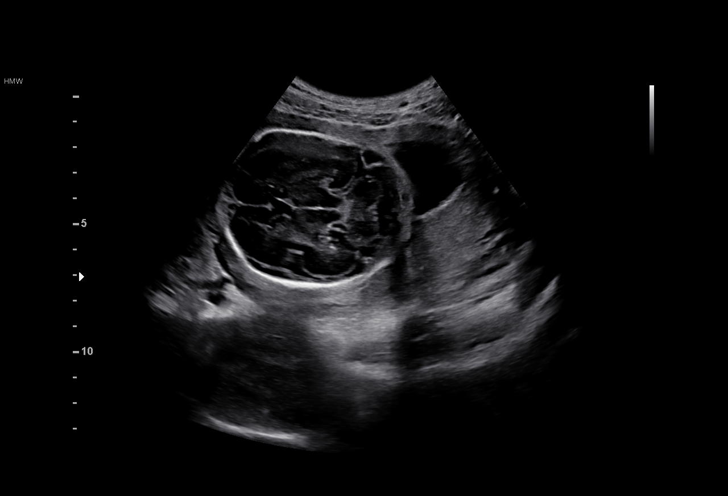
[im 39/44]
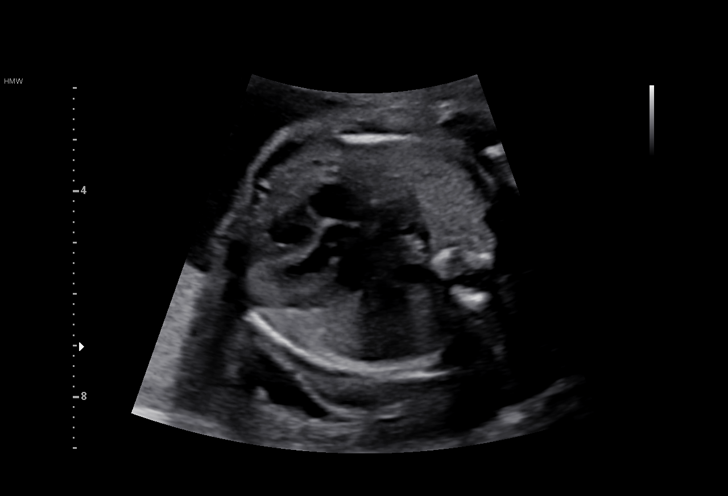
[im 42/44]
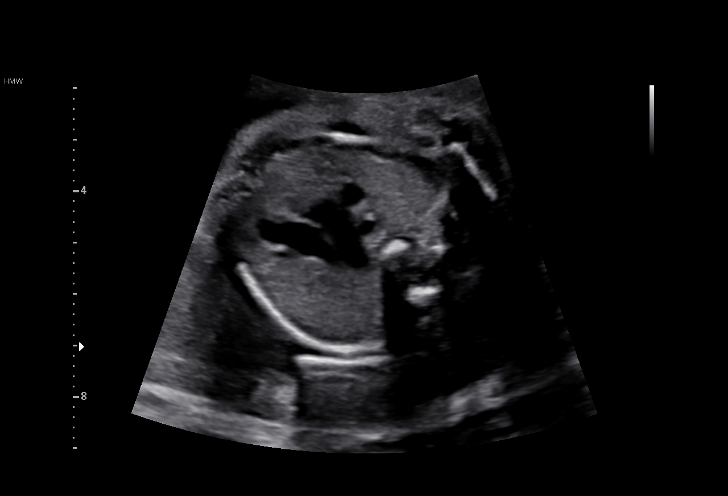

[13 of 28 positions shown; findings below may reference images not displayed]

----------------------------------------------------------------------

 ----------------------------------------------------------------------
Indications

  Marginal insertion of umbilical cord affecting
  management of mother in second trimester
  24 weeks gestation of pregnancy
  Encounter for antenatal screening for
  malformations
 ----------------------------------------------------------------------
Fetal Evaluation

 Num Of Fetuses:         1
 Fetal Heart Rate(bpm):  154
 Cardiac Activity:       Observed
 Presentation:           Cephalic
 Placenta:               Posterior
 P. Cord Insertion:      Marginal insertion

 Amniotic Fluid
 AFI FV:      Within normal limits

                             Largest Pocket(cm)

Biometry

 BPD:      59.5  mm     G. Age:  24w 2d         27  %    CI:         69.1   %    70 - 86
                                                         FL/HC:      19.4   %    18.7 -
 HC:      228.6  mm     G. Age:  24w 6d         36  %    HC/AC:      1.15        1.04 -
 AC:      199.5  mm     G. Age:  24w 4d         37  %    FL/BPD:     74.5   %    71 - 87
 FL:       44.3  mm     G. Age:  24w 4d         32  %    FL/AC:      22.2   %    20 - 24
 HUM:      43.2  mm     G. Age:  25w 5d         70  %

 Est. FW:     714  gm      1 lb 9 oz     35  %
OB History
 Blood Type:    A+
 Gravidity:    1         Term:   0        Prem:   0        SAB:   0
 TOP:          0       Ectopic:  0        Living: 0
Gestational Age

 LMP:           23w 5d        Date:  12/29/18                 EDD:   10/05/19
 U/S Today:     24w 4d                                        EDD:   09/29/19
 Best:          24w 5d     Det. By:  U/S  (05/09/19)          EDD:   09/28/19
Anatomy

 Cranium:               Appears normal         Aortic Arch:            Previously seen
 Cavum:                 Appears normal         Ductal Arch:            Previously seen
 Ventricles:            Appears normal         Diaphragm:              Previously seen
 Choroid Plexus:        Previously seen        Stomach:                Appears normal, left
                                                                       sided
 Cerebellum:            Previously seen        Abdomen:                Previously seen
 Posterior Fossa:       Previously seen        Abdominal Wall:         Previously seen
 Nuchal Fold:           Previously seen        Cord Vessels:           Previously seen
 Face:                  Orbits and profile     Kidneys:                Appear normal
                        previously seen
 Lips:                  Previously seen        Bladder:                Appears normal
 Thoracic:              Appears normal         Spine:                  Previously seen
 Heart:                 Appears normal         Upper Extremities:      Previously seen
                        (4CH, axis, and
                        situs)
 RVOT:                  Appears normal         Lower Extremities:      Previously seen
 LVOT:                  Appears normal

 Other:  Female gender Heels and 5th digit visualized previously. Nasal bone
         visualized previously. Technically difficult due to fetal position.
Cervix Uterus Adnexa

 Cervix
 Length:            3.6  cm.
 Normal appearance by transabdominal scan.

 Uterus
 No abnormality visualized.

 Left Ovary
 No adnexal mass visualized.

 Right Ovary
 No adnexal mass visualized.

 Cul De Sac
 No free fluid seen.

 Adnexa
 No abnormality visualized.
Comments

 This patient was seen for a follow up growth scan due to a
 marginal placental cord insertion noted during her prior exam.
 She denies any problems since her last exam.
 She was informed that the fetal growth and amniotic fluid
 level appears appropriate for her gestational age.
 A follow up exam was scheduled in 4 weeks.

## 2020-03-26 DIAGNOSIS — Z20822 Contact with and (suspected) exposure to covid-19: Secondary | ICD-10-CM | POA: Diagnosis not present

## 2020-08-03 ENCOUNTER — Other Ambulatory Visit: Payer: Self-pay

## 2020-08-03 ENCOUNTER — Other Ambulatory Visit: Payer: Self-pay | Admitting: Family Medicine

## 2020-08-03 ENCOUNTER — Encounter: Payer: Self-pay | Admitting: Family Medicine

## 2020-08-03 ENCOUNTER — Ambulatory Visit (INDEPENDENT_AMBULATORY_CARE_PROVIDER_SITE_OTHER): Payer: BLUE CROSS/BLUE SHIELD | Admitting: Family Medicine

## 2020-08-03 ENCOUNTER — Ambulatory Visit (INDEPENDENT_AMBULATORY_CARE_PROVIDER_SITE_OTHER): Payer: BLUE CROSS/BLUE SHIELD

## 2020-08-03 VITALS — BP 97/65 | HR 63 | Ht 63.0 in | Wt 113.0 lb

## 2020-08-03 DIAGNOSIS — R0781 Pleurodynia: Secondary | ICD-10-CM

## 2020-08-03 DIAGNOSIS — M25512 Pain in left shoulder: Secondary | ICD-10-CM

## 2020-08-03 DIAGNOSIS — M542 Cervicalgia: Secondary | ICD-10-CM

## 2020-08-03 DIAGNOSIS — M25552 Pain in left hip: Secondary | ICD-10-CM

## 2020-08-03 MED ORDER — MELOXICAM 7.5 MG PO TABS: 7.5000 mg | ORAL_TABLET | Freq: Two times a day (BID) | ORAL | 0 refills | Status: DC | PRN

## 2020-08-03 MED ORDER — CYCLOBENZAPRINE HCL 10 MG PO TABS: 5.0000 mg | ORAL_TABLET | Freq: Three times a day (TID) | ORAL | 0 refills | Status: DC | PRN
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
## 2020-10-04 ENCOUNTER — Encounter: Payer: Self-pay | Admitting: Family Medicine

## 2020-10-04 ENCOUNTER — Ambulatory Visit (INDEPENDENT_AMBULATORY_CARE_PROVIDER_SITE_OTHER): Payer: BLUE CROSS/BLUE SHIELD | Admitting: Family Medicine

## 2020-10-04 ENCOUNTER — Other Ambulatory Visit: Payer: Self-pay

## 2020-10-04 VITALS — BP 116/78 | HR 76 | Wt 110.6 lb

## 2020-10-04 DIAGNOSIS — J4521 Mild intermittent asthma with (acute) exacerbation: Secondary | ICD-10-CM | POA: Diagnosis not present

## 2020-10-04 DIAGNOSIS — J069 Acute upper respiratory infection, unspecified: Secondary | ICD-10-CM

## 2020-10-04 DIAGNOSIS — M940 Chondrocostal junction syndrome [Tietze]: Secondary | ICD-10-CM | POA: Diagnosis not present

## 2020-10-04 LAB — POCT RAPID STREP A (OFFICE): Rapid Strep A Screen: NEGATIVE

## 2020-10-04 LAB — POCT INFLUENZA A/B
Influenza A, POC: NEGATIVE
Influenza B, POC: NEGATIVE

## 2020-10-04 MED ORDER — ALBUTEROL SULFATE HFA 108 (90 BASE) MCG/ACT IN AERS: 2.0000 | INHALATION_SPRAY | Freq: Four times a day (QID) | RESPIRATORY_TRACT | 11 refills | Status: DC | PRN

## 2020-10-04 MED ORDER — PREDNISONE 20 MG PO TABS: 40.0000 mg | ORAL_TABLET | Freq: Every day | ORAL | 0 refills | Status: AC

## 2020-10-04 NOTE — Telephone Encounter (Signed)
Patient scheduled with Hyman Hopes for this afternoon at 3pm. AM

## 2020-10-04 NOTE — Patient Instructions (Signed)
Albuterol inhaler and prednisone (steroid) burst sent in. Let me know if you aren't feeling any better or get worse by the end of this week. Rest up and stay hydrated!  Over the counter medications that may be helpful for symptoms:  . Guaifenesin 1200 mg extended release tabs twice daily, with plenty of water o For cough and congestion o Brand name: Mucinex   . Pseudoephedrine 30 mg, one or two tabs every 4 to 6 hours o For sinus congestion o Brand name: Sudafed o You must get this from the pharmacy counter.  . Oxymetazoline nasal spray each morning, one spray in each nostril, for NO MORE THAN 3 days  o For nasal and sinus congestion o Brand name: Afrin . Saline nasal spray or Saline Nasal Irrigation 3-5 times a day o For nasal and sinus congestion o Brand names: Ocean or AYR . Fluticasone nasal spray, one spray in each nostril, each morning (after oxymetazoline and saline, if used) o For nasal and sinus congestion o Brand name: Flonase . Warm salt water gargles  o For sore throat o Every few hours as needed . Alternate ibuprofen 400-600 mg and acetaminophen 1000 mg every 4-6 hours o For fever, body aches, headache o Brand names: Motrin or Advil and Tylenol . Dextromethorphan 12-hour cough version 30 mg every 12 hours  o For cough o Brand name: Delsym Stop all other cold medications for now (Nyquil, Dayquil, Tylenol Cold, Theraflu, etc) and other non-prescription cough/cold preparations. Many of these have the same ingredients listed above and could cause an overdose of medication.   Herbal treatments that have been shown to be helpful in some patients include: Vitamin C 1000mg  per day Vitamin D 4000iU per day Zinc 100mg  per day Quercetin 25-500mg  twice a day Melatonin 5-10mg  at bedtime  General Instructions . Allow your body to rest . Drink PLENTY of fluids   If you develop severe shortness of breath, uncontrolled fevers, coughing up blood, confusion, chest pain, or signs  of dehydration (such as significantly decreased urine amounts or dizziness with standing) please go to the ER.

## 2020-10-04 NOTE — Progress Notes (Signed)
Acute Office Visit  Subjective:    Patient ID: Sheila [name], female    DOB: 1995-08-29, 25 y.o.   MRN: [id number]  Chief Complaint  Patient presents with  . Cough  . URI    HPI Patient is in today for sore throat, cough, chest tightness.  On Thursday, patient started to notice a sore throat, and by Friday she also had a headache, mild bilateral ear pain, felt feverish (didn't check temp), but had a negative COVID test at work. She describes the sore throat as feeling like a rock is scratching her throat. She then started having a dry cough with some upper chest tightness that she describes as a pulling sensation behind her ribs that feels like sandpaper. The cough has progressively gotten worse, but remains dry and non-productive. By Sunday she had lost her voice and other symptoms persisted. She took some Theraflu and Nyquil which seemed to help temporarily. She did have some rhinorrhea starting last night.   She denies seasonal allergies, chills, night sweats, nausea, vomiting, diarrhea, chest pain, fatigue, wheezing, pain with inspiration. Reports some mild shortness of breath but has not been bad enough to use her inhaler (history of exercise induced asthma). 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     Past Medical History:  Diagnosis Date  . Asthma     Past Surgical History:  Procedure Laterality Date  . NO PAST SURGERIES      Family History  Problem Relation Age of Onset  . Hypothyroidism Mother   . Hyperlipidemia Father     Social History   Socioeconomic History  . Marital status: Single    Spouse name: Not on file  . Number of children: Not on file  . Years of education: Not on file  . Highest education level: Not on file  Occupational History  . Occupation: Ship broker  Tobacco Use  . Smoking status: Never Smoker  . Smokeless tobacco: Never Used  Vaping Use  . Vaping Use: Never used  Substance and Sexual Activity   . Alcohol use: No  . Drug use: No  . Sexual activity: Yes    Birth control/protection: I.U.D.  Other Topics Concern  . Not on file  Social History Narrative   Track and cheerleading.    Social Determinants of Health   Financial Resource Strain: Not on file  Food Insecurity: Not on file  Transportation Needs: Not on file  Physical Activity: Not on file  Stress: Not on file  Social Connections: Not on file  Intimate Partner Violence: Not on file    Outpatient Medications Prior to Visit  Medication Sig Dispense Refill  . acetaminophen (TYLENOL) 500 MG tablet Take 500 mg by mouth every 6 (six) hours as needed for moderate pain.    Marland Kitchen ibuprofen (ADVIL) 600 MG tablet Take 1 tablet (600 mg total) by mouth every 6 (six) hours. 30 tablet 0  . levonorgestrel (MIRENA) 20 MCG/24HR IUD 1 each by Intrauterine route once.    . cyclobenzaprine (FLEXERIL) 10 MG tablet Take 0.5-1 tablets (5-10 mg total) by mouth 3 (three) times daily as needed for muscle spasms. 30 tablet 0  . meloxicam (MOBIC) 7.5 MG tablet Take 1 tablet (7.5 mg total) by mouth 2 (two) times daily as needed for pain. 40 tablet 0   No facility-administered medications prior to visit.    Allergies  Allergen Reactions  . Amoxicillin Anaphylaxis    Review of Systems All  review of systems negative except what is listed in the HPI      Objective:    Physical Exam Vitals reviewed.  Constitutional:      General: She is not in acute distress.    Appearance: Normal appearance. She is normal weight. She is not ill-appearing.  HENT:     Head: Normocephalic and atraumatic.     Right Ear: Tympanic membrane normal.     Left Ear: Tympanic membrane normal.     Nose: Rhinorrhea present.     Right Sinus: No maxillary sinus tenderness or frontal sinus tenderness.     Left Sinus: No maxillary sinus tenderness or frontal sinus tenderness.     Mouth/Throat:     Mouth: Mucous membranes are moist.     Pharynx: Oropharynx is clear.  Posterior oropharyngeal erythema present. No oropharyngeal exudate or uvula swelling.     Tonsils: 1+ on the right. 1+ on the left.  Eyes:     Extraocular Movements: Extraocular movements intact.     Pupils: Pupils are equal, round, and reactive to light.  Cardiovascular:     Rate and Rhythm: Normal rate and regular rhythm.     Pulses: Normal pulses.     Heart sounds: Normal heart sounds. No friction rub.  Pulmonary:     Effort: Pulmonary effort is normal. No respiratory distress.     Breath sounds: Normal breath sounds. No wheezing, rhonchi or rales.  Abdominal:     General: Bowel sounds are normal.     Palpations: Abdomen is soft.  Musculoskeletal:     Cervical back: Normal range of motion and neck supple. No tenderness.  Lymphadenopathy:     Cervical: No cervical adenopathy.  Skin:    General: Skin is warm and dry.  Neurological:     General: No focal deficit present.     Mental Status: She is alert and oriented to person, place, and time. Mental status is at baseline.  Psychiatric:        Mood and Affect: Mood normal.        Behavior: Behavior normal.        Thought Content: Thought content normal.        Judgment: Judgment normal.     BP 116/78   Pulse 76   Wt 110 lb 9.6 oz (50.2 kg)   SpO2 99%   BMI 19.59 kg/m  Wt Readings from Last 3 Encounters:  10/04/20 110 lb 9.6 oz (50.2 kg)  08/03/20 113 lb (51.3 kg)  01/07/20 113 lb (51.3 kg)    Health Maintenance Due  Topic Date Due  . Hepatitis C Screening  Never done  . HPV VACCINES (1 - 2-dose series) Never done       Topic Date Due  . HPV VACCINES (1 - 2-dose series) Never done     No results found for: TSH Lab Results  Component Value Date   WBC 15.4 (H) 10/01/2019   HGB 11.5 (L) 10/01/2019   HCT 36.4 10/01/2019   MCV 91.5 10/01/2019   PLT 399 10/01/2019   No results found for: NA, K, CHLORIDE, CO2, GLUCOSE, BUN, CREATININE, BILITOT, ALKPHOS, AST, ALT, PROT, ALBUMIN, CALCIUM, ANIONGAP, EGFR, GFR No  results found for: CHOL No results found for: HDL No results found for: LDLCALC No results found for: TRIG No results found for: CHOLHDL No results found for: HGBA1C     Assessment & Plan:   1. Viral upper respiratory tract infection 2. Costochondritis 3. Mild intermittent asthma  with exacerbation  Negative strep and flu tests today. No adventitious heart or lung sounds. X-ray not indicated at this time. Will order if symptoms persist despite conservative treatments and prednisone burst.   Given duration of symptoms, patient likely with viral URI and cough that may be triggering a mild asthma exacerbation and costochondritis. I am going to send in a new prescription for albuterol inhaler since hers is from high school and likely expired. Would also like to do a prednisone burst to see if this helps improve her breathing, cough, and chest tightness. If symptoms persist or worsen before Thursday/Friday of this week, will give her an antibiotic before the weekend. Encouraged OTC cold medications, ibuprofen/tylenole PRN, rest, hydration, humidifier use, salt water gargles, warm liquids/teas, honey, steam showers. Educated on signs and symptoms requiring further evaluation.  - albuterol (VENTOLIN HFA) 108 (90 Base) MCG/ACT inhaler; Inhale 2 puffs into the lungs every 6 (six) hours as needed for wheezing.  Dispense: 2 each; Refill: 11 - predniSONE (DELTASONE) 20 MG tablet; Take 2 tablets (40 mg total) by mouth daily with breakfast for 5 days.  Dispense: 10 tablet; Refill: 0 - POCT rapid strep A - POCT Influenza A/B Follow-up if symptoms worsen or fail to improve.    Terrilyn Saver, NP

## 2021-01-03 ENCOUNTER — Encounter: Payer: Self-pay | Admitting: Obstetrics & Gynecology

## 2021-01-03 ENCOUNTER — Other Ambulatory Visit (HOSPITAL_COMMUNITY)
Admission: RE | Admit: 2021-01-03 | Discharge: 2021-01-03 | Disposition: A | Discharge status: Home / Self Care | Payer: BLUE CROSS/BLUE SHIELD | Source: Ambulatory Visit | Attending: Obstetrics & Gynecology | Admitting: Obstetrics & Gynecology

## 2021-01-03 ENCOUNTER — Other Ambulatory Visit: Payer: Self-pay

## 2021-01-03 ENCOUNTER — Ambulatory Visit (INDEPENDENT_AMBULATORY_CARE_PROVIDER_SITE_OTHER): Payer: BLUE CROSS/BLUE SHIELD | Admitting: Obstetrics & Gynecology

## 2021-01-03 VITALS — BP 99/60 | HR 78 | Ht 63.0 in

## 2021-01-03 DIAGNOSIS — Z113 Encounter for screening for infections with a predominantly sexual mode of transmission: Secondary | ICD-10-CM

## 2021-01-03 DIAGNOSIS — Z01419 Encounter for gynecological examination (general) (routine) without abnormal findings: Secondary | ICD-10-CM

## 2021-01-03 NOTE — Progress Notes (Signed)
Subjective:     Sheila [name] is a 25 y.o. female here for a routine exam.  Current complaints: none.     Gynecologic History No LMP recorded. (Menstrual status: IUD). Contraception: IUD Last Pap: 2020. Results were: normal  Obstetric History OB History  Gravida Para Term Preterm AB Living  1 1 1  0 0 1  SAB IAB Ectopic Multiple Live Births  0 0 0 0 1    # Outcome Date GA Lbr Len/2nd Weight Sex Delivery Anes PTL Lv  1 Term 10/02/19 [redacted]w[redacted]d 22:25 / 00:13 6 lb 13.2 oz (3.096 kg) F Vag-Spont EPI  LIV     The following portions of the patient's history were reviewed and updated as appropriate: allergies, current medications, past family history, past medical history, past social history, past surgical history, and problem list.  Review of Systems Pertinent items noted in HPI and remainder of comprehensive ROS otherwise negative.    Objective:   Vitals:   01/03/21 0927  BP: 99/60  Pulse: 78  Height: 5\' 3"  (1.6 m)   Vitals:  WNL General appearance: alert, cooperative and no distress  HEENT: Normocephalic, without obvious abnormality, atraumatic Eyes: negative Throat: lips, mucosa, and tongue normal; teeth and gums normal  Respiratory: Clear to auscultation bilaterally  CV: Regular rate and rhythm  Breasts:  Normal appearance, no masses or tenderness, no nipple retraction or dimpling  GI: Soft, non-tender; bowel sounds normal; no masses,  no organomegaly  GU: External Genitalia:  Tanner V, no lesion Urethra:  No prolapse   Vagina: Pink, normal rugae, no blood or discharge  Cervix: No CMT, no lesion  Uterus:  Normal size and contour, non tender  Adnexa: Normal, no masses, non tender  Musculoskeletal: No edema, redness or tenderness in the calves or thighs  Skin: No lesions or rash  Lymphatic: Axillary adenopathy: none     Psychiatric: Normal mood and behavior   Assessment:    Healthy female exam.    Plan:   Pap not due until 2023.  GC/Chlam testing.  IUD strings not  seen; bedside done.  In endometrial canal near fundus (approx 1 cm away).  Discussed with patient that IUD still functioning.  She has no symptoms of bleeding nor pain.  She will keep 2024 abreast.   RTC 1 year.

## 2021-01-04 LAB — CERVICOVAGINAL ANCILLARY ONLY
Chlamydia: NEGATIVE
Comment: NEGATIVE
Comment: NORMAL
Neisseria Gonorrhea: NEGATIVE

## 2021-02-08 DIAGNOSIS — Z20822 Contact with and (suspected) exposure to covid-19: Secondary | ICD-10-CM | POA: Diagnosis not present

## 2021-02-18 ENCOUNTER — Telehealth: Payer: Self-pay | Admitting: Family Medicine

## 2021-02-18 MED ORDER — OSELTAMIVIR PHOSPHATE 75 MG PO CAPS: 75.0000 mg | ORAL_CAPSULE | Freq: Two times a day (BID) | ORAL | 0 refills | Status: DC

## 2021-02-18 NOTE — Telephone Encounter (Signed)
Mom has been exposed to her daughter who has been diagnosed with influenza A and is a patient here today.  Mom has noticed her lymph nodes are starting to feel little sore which is usually her first time that she is getting sick.  We discussed options of prophylaxis or treating for COVID if her symptoms worsen over the weekend.  We will go ahead and send over prescription for Tamiflu to pharmacy.  If any problems or not improving.

## 2021-02-20 ENCOUNTER — Encounter: Payer: Self-pay | Admitting: Family Medicine

## 2021-02-21 ENCOUNTER — Other Ambulatory Visit: Payer: Self-pay

## 2021-02-21 ENCOUNTER — Ambulatory Visit (INDEPENDENT_AMBULATORY_CARE_PROVIDER_SITE_OTHER): Payer: BLUE CROSS/BLUE SHIELD | Admitting: Physician Assistant

## 2021-02-21 VITALS — BP 114/64 | HR 95 | Temp 99.4°F | Ht 63.0 in | Wt 111.0 lb

## 2021-02-21 DIAGNOSIS — R6889 Other general symptoms and signs: Secondary | ICD-10-CM | POA: Diagnosis not present

## 2021-02-21 DIAGNOSIS — J029 Acute pharyngitis, unspecified: Secondary | ICD-10-CM

## 2021-02-21 DIAGNOSIS — Z20828 Contact with and (suspected) exposure to other viral communicable diseases: Secondary | ICD-10-CM

## 2021-02-21 DIAGNOSIS — J101 Influenza due to other identified influenza virus with other respiratory manifestations: Secondary | ICD-10-CM | POA: Diagnosis not present

## 2021-02-21 LAB — POCT INFLUENZA A/B
Influenza A, POC: POSITIVE — AB
Influenza B, POC: POSITIVE — AB

## 2021-02-21 LAB — POCT RAPID STREP A (OFFICE): Rapid Strep A Screen: NEGATIVE

## 2021-02-21 MED ORDER — IBUPROFEN 800 MG PO TABS: 800.0000 mg | ORAL_TABLET | Freq: Three times a day (TID) | ORAL | 2 refills | Status: DC | PRN

## 2021-02-21 MED ORDER — OSELTAMIVIR PHOSPHATE 75 MG PO CAPS: 75.0000 mg | ORAL_CAPSULE | Freq: Two times a day (BID) | ORAL | 0 refills | Status: DC

## 2021-02-21 MED ORDER — LIDOCAINE VISCOUS HCL 2 % MT SOLN: 15.0000 mL | OROMUCOSAL | 0 refills | Status: DC | PRN

## 2021-02-21 NOTE — Patient Instructions (Signed)
Influenza, Adult °Influenza, also called "the flu," is a viral infection that mainly affects the respiratory tract. This includes the lungs, nose, and throat. The flu spreads easily from person to person (is contagious). It causes common cold symptoms, along with high fever and body aches. °What are the causes? °This condition is caused by the influenza virus. You can get the virus by: °Breathing in droplets that are in the air from an infected person's cough or sneeze. °Touching something that has the virus on it (has been contaminated) and then touching your mouth, nose, or eyes. °What increases the risk? °The following factors may make you more likely to get the flu: °Not washing or sanitizing your hands often. °Having close contact with many people during cold and flu season. °Touching your mouth, eyes, or nose without first washing or sanitizing your hands. °Not getting an annual flu shot. °You may have a higher risk for the flu, including serious problems, such as a lung infection (pneumonia), if you: °Are older than 65. °Are pregnant. °Have a weakened disease-fighting system (immune system). This includes people who have HIV or AIDS, are on chemotherapy, or are taking medicines that reduce (suppress) the immune system. °Have a long-term (chronic) illness, such as heart disease, kidney disease, diabetes, or lung disease. °Have a liver disorder. °Are severely overweight (morbidly obese). °Have anemia. °Have asthma. °What are the signs or symptoms? °Symptoms of this condition usually begin suddenly and last 4-14 days. These may include: °Fever and chills. °Headaches, body aches, or muscle aches. °Sore throat. °Cough. °Runny or stuffy (congested) nose. °Chest discomfort. °Poor appetite. °Weakness or fatigue. °Dizziness. °Nausea or vomiting. °How is this diagnosed? °This condition may be diagnosed based on: °Your symptoms and medical history. °A physical exam. °Swabbing your nose or throat and testing the fluid  for the influenza virus. °How is this treated? °If the flu is diagnosed early, you can be treated with antiviral medicine that is given by mouth (orally) or through an IV. This can help reduce how severe the illness is and how long it lasts. °Taking care of yourself at home can help relieve symptoms. Your health care provider may recommend: °Taking over-the-counter medicines. °Drinking plenty of fluids. °In many cases, the flu goes away on its own. If you have severe symptoms or complications, you may be treated in a hospital. °Follow these instructions at home: °Activity °Rest as needed and get plenty of sleep. °Stay home from work or school as told by your health care provider. Unless you are visiting your health care provider, avoid leaving home until your fever has been gone for 24 hours without taking medicine. °Eating and drinking °Take an oral rehydration solution (ORS). This is a drink that is sold at pharmacies and retail stores. °Drink enough fluid to keep your urine pale yellow. °Drink clear fluids in small amounts as you are able. Clear fluids include water, ice chips, fruit juice mixed with water, and low-calorie sports drinks. °Eat bland, easy-to-digest foods in small amounts as you are able. These foods include bananas, applesauce, rice, lean meats, toast, and crackers. °Avoid drinking fluids that contain a lot of sugar or caffeine, such as energy drinks, regular sports drinks, and soda. °Avoid alcohol. °Avoid spicy or fatty foods. °General instructions °  °Take over-the-counter and prescription medicines only as told by your health care provider. °Use a cool mist humidifier to add humidity to the air in your home. This can make it easier to breathe. °When using a cool mist humidifier,   clean it daily. Empty the water and replace it with clean water. °Cover your mouth and nose when you cough or sneeze. °Wash your hands with soap and water often and for at least 20 seconds, especially after you cough or  sneeze. If soap and water are not available, use alcohol-based hand sanitizer. °Keep all follow-up visits. This is important. °How is this prevented? ° °Get an annual flu shot. This is usually available in late summer, fall, or winter. Ask your health care provider when you should get your flu shot. °Avoid contact with people who are sick during cold and flu season. This is generally fall and winter. °Contact a health care provider if: °You develop new symptoms. °You have: °Chest pain. °Diarrhea. °A fever. °Your cough gets worse. °You produce more mucus. °You feel nauseous or you vomit. °Get help right away if you: °Develop shortness of breath or have difficulty breathing. °Have skin or nails that turn a bluish color. °Have severe pain or stiffness in your neck. °Develop a sudden headache or sudden pain in your face or ear. °Cannot eat or drink without vomiting. °These symptoms may represent a serious problem that is an emergency. Do not wait to see if the symptoms will go away. Get medical help right away. Call your local emergency services (911 in the [place] Do not drive yourself to the hospital. °Summary °Influenza, also called "the flu," is a viral infection that primarily affects your respiratory tract. °Symptoms of the flu usually begin suddenly and last 4-14 days. °Getting an annual flu shot is the best way to prevent getting the flu. °Stay home from work or school as told by your health care provider. Unless you are visiting your health care provider, avoid leaving home until your fever has been gone for 24 hours without taking medicine. °Keep all follow-up visits. This is important. °This information is not intended to replace advice given to you by your health care provider. Make sure you discuss any questions you have with your health care provider. °Document Revised: 01/09/2020 Document Reviewed: 01/09/2020 °Elsevier Patient Education © 2022 [employer] ° °

## 2021-02-21 NOTE — Progress Notes (Signed)
Subjective:    Patient ID: Sheila [name], female    DOB: [birth year], 25 y.o.   MRN: [id number]  HPI Pt is a 25 yo female who presents to the clinic with 3 days of ST, fever, chills, body aches. Her daughter was dx with flu A on Friday. She never picked up tamiflu that her PCP sent to clinic. She is using OTC Theraflu with little relief. Her throat is so sore she wants to make sure she does not have strep throat. No cough, SOB, difficulty breathing.   .. 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   Lab test positive for detection of COVID-19 virus 09/24/2019   Resolved Ambulatory Problems    Diagnosis Date Noted   Cough 05/07/2006   CHEST PAIN, ACUTE 05/07/2006   Right shoulder strain 12/19/2013   Screening for sickle-cell disease or trait 12/22/2015   Need for Menactra vaccination 12/22/2015   Screening for deficiency anemia 12/22/2015   Chest trauma 02/25/2016   Supervision of normal first pregnancy 03/13/2019   Marginal insertion of umbilical cord affecting management of mother 06/10/2019   GBS bacteriuria 09/02/2019   Post-dates pregnancy 10/01/2019   Past Medical History:  Diagnosis Date   Asthma      Review of Systems See HPI.     Objective:   Physical Exam Vitals reviewed.  Constitutional:      Appearance: She is well-developed.  HENT:     Head: Normocephalic.     Right Ear: Tympanic membrane normal.     Left Ear: Tympanic membrane normal.     Nose: Rhinorrhea present.     Mouth/Throat:     Mouth: Mucous membranes are moist.     Tonsils: Tonsillar exudate and tonsillar abscess present. 2+ on the right. 2+ on the left.  Eyes:     Conjunctiva/sclera: Conjunctivae normal.  Cardiovascular:     Rate and Rhythm: Normal rate and regular rhythm.  Pulmonary:     Effort: Pulmonary effort is normal.     Breath sounds: Normal breath sounds.  Abdominal:      General: Bowel sounds are normal.     Palpations: Abdomen is soft.  Lymphadenopathy:     Cervical: Cervical adenopathy present.  Neurological:     Mental Status: She is alert.  Psychiatric:        Mood and Affect: Mood normal.    FLU A and B positive.  Negative Strep.  .. Results for orders placed or performed in visit on 02/21/21  POCT Influenza A/B  Result Value Ref Range   Influenza A, POC Positive (A) Negative   Influenza B, POC Positive (A) Negative  POCT rapid strep A  Result Value Ref Range   Rapid Strep A Screen Negative Negative        Assessment & Plan:  Marland KitchenMarland KitchenKrystin was seen today for sore throat.  Diagnoses and all orders for this visit:  Influenza A -     oseltamivir (TAMIFLU) 75 MG capsule; Take 1 capsule (75 mg total) by mouth 2 (two) times daily. For 5 days. -     ibuprofen (ADVIL) 800 MG tablet; Take 1 tablet (800 mg total) by mouth every 8 (eight) hours as needed.  Flu-like symptoms -     ibuprofen (ADVIL) 800 MG tablet; Take 1 tablet (800 mg total) by mouth every 8 (eight) hours as needed.  Exposure to the flu -  ibuprofen (ADVIL) 800 MG tablet; Take 1 tablet (800 mg total) by mouth every 8 (eight) hours as needed.  Sore throat -     ibuprofen (ADVIL) 800 MG tablet; Take 1 tablet (800 mg total) by mouth every 8 (eight) hours as needed. -     lidocaine (XYLOCAINE) 2 % solution; Use as directed 15 mLs in the mouth or throat every 3 (three) hours as needed (mouth/throat pain - gargle and spit).  Influenza B -     oseltamivir (TAMIFLU) 75 MG capsule; Take 1 capsule (75 mg total) by mouth 2 (two) times daily. For 5 days. -     ibuprofen (ADVIL) 800 MG tablet; Take 1 tablet (800 mg total) by mouth every 8 (eight) hours as needed.  Within 3 day window of flu. Start tamiflu.  Strep negative.  Ibuprofen alternated with tylenol.  Lidocaine mouthwash if needed.  Symptomatic care discussed.  Follow up as needed or if symptoms worsen.  Written out of work  until fever free for 24 hours and feeling better.

## 2021-02-22 DIAGNOSIS — J1100 Influenza due to unidentified influenza virus with unspecified type of pneumonia: Secondary | ICD-10-CM | POA: Diagnosis not present

## 2021-02-22 DIAGNOSIS — R509 Fever, unspecified: Secondary | ICD-10-CM | POA: Diagnosis not present

## 2021-02-22 DIAGNOSIS — R070 Pain in throat: Secondary | ICD-10-CM | POA: Diagnosis not present

## 2021-02-22 DIAGNOSIS — E785 Hyperlipidemia, unspecified: Secondary | ICD-10-CM | POA: Diagnosis not present

## 2021-02-22 DIAGNOSIS — J111 Influenza due to unidentified influenza virus with other respiratory manifestations: Secondary | ICD-10-CM | POA: Diagnosis not present

## 2021-02-22 DIAGNOSIS — Z87892 Personal history of anaphylaxis: Secondary | ICD-10-CM | POA: Diagnosis not present

## 2021-02-22 DIAGNOSIS — R1110 Vomiting, unspecified: Secondary | ICD-10-CM | POA: Diagnosis not present

## 2021-02-22 DIAGNOSIS — Z880 Allergy status to penicillin: Secondary | ICD-10-CM | POA: Diagnosis not present

## 2021-02-24 ENCOUNTER — Other Ambulatory Visit: Payer: Self-pay

## 2021-02-24 ENCOUNTER — Encounter: Payer: Self-pay | Admitting: Obstetrics and Gynecology

## 2021-02-24 ENCOUNTER — Ambulatory Visit (INDEPENDENT_AMBULATORY_CARE_PROVIDER_SITE_OTHER): Payer: BLUE CROSS/BLUE SHIELD | Admitting: Obstetrics and Gynecology

## 2021-02-24 ENCOUNTER — Other Ambulatory Visit (HOSPITAL_COMMUNITY)
Admission: RE | Admit: 2021-02-24 | Discharge: 2021-02-24 | Disposition: A | Discharge status: Home / Self Care | Payer: BLUE CROSS/BLUE SHIELD | Source: Ambulatory Visit | Attending: Obstetrics and Gynecology | Admitting: Obstetrics and Gynecology

## 2021-02-24 VITALS — BP 96/60 | HR 62 | Ht 63.0 in | Wt 112.0 lb

## 2021-02-24 DIAGNOSIS — N898 Other specified noninflammatory disorders of vagina: Secondary | ICD-10-CM | POA: Diagnosis not present

## 2021-02-24 NOTE — Progress Notes (Signed)
Pt c/o painful vaginal lesion and increased vaginal discharge with an odor

## 2021-02-24 NOTE — Progress Notes (Signed)
GYNECOLOGY OFFICE VISIT NOTE  History:   Sheila [name] is a 25 y.o. G1P1001 here today for vaginal bump that look like a cyst. It hurts to pee and sit down. She has one vaginal ulcer.   She has had fevers but also recently diagnosed with influenza A and B. She started tamiflu but had n/v so she stopped it. She has now been without a fever for 24 hours  She is sexually active with one partner and it is a new partner recently. Her prior partner was the father of her child but that was a couple years ago.    She notes watery vaginal discharge.  She denies any abnormal vaginal bleeding, pelvic pain or other concerns.     Past Medical History:  Diagnosis Date   Asthma     Past Surgical History:  Procedure Laterality Date   NO PAST SURGERIES      The following portions of the patient's history were reviewed and updated as appropriate: allergies, current medications, past family history, past medical history, past social history, past surgical history and problem list.   Health Maintenance:  Normal pap 03/2019.  Review of Systems:  Pertinent items noted in HPI and remainder of comprehensive ROS otherwise negative.  Physical Exam:  BP 96/60   Pulse 62   Ht 5\' 3"  (1.6 m)   Wt 112 lb (50.8 kg)   BMI 19.84 kg/m  CONSTITUTIONAL: Well-developed, well-nourished female in no acute distress.  HEENT:  Normocephalic, atraumatic. External right and left ear normal. No scleral icterus.  NECK: Normal range of motion, supple, no masses noted on observation SKIN: No rash noted. Not diaphoretic. No erythema. No pallor. MUSCULOSKELETAL: Normal range of motion. No edema noted. NEUROLOGIC: Alert and oriented to person, place, and time. Normal muscle tone coordination. No cranial nerve deficit noted. PSYCHIATRIC: Normal mood and affect. Normal behavior. Normal judgment and thought content.  CARDIOVASCULAR: Normal heart rate noted RESPIRATORY: Effort and breath sounds normal, no problems with  respiration noted ABDOMEN: No masses noted. No other overt distention noted.    PELVIC:  NEFG except on the left labia minora she had a single ulcer that was 2-3 mm in size with granulation tissue forming.   Labs and Imaging Results for orders placed or performed in visit on 02/21/21 (from the past 168 hour(s))  POCT Influenza A/B   Collection Time: 02/21/21  2:31 PM  Result Value Ref Range   Influenza A, POC Positive (A) Negative   Influenza B, POC Positive (A) Negative  POCT rapid strep A   Collection Time: 02/21/21  2:31 PM  Result Value Ref Range   Rapid Strep A Screen Negative Negative   No results found.    Assessment and Plan:  Diagnoses and all orders for this visit:  Vaginal sore -     Herpes culture, rapid - Could be a viral apthous ulcer vs HSV - Offered Valtrex prophylactically - she declines.  - She will do comfort measures until then, vaseline given.   Vaginal discharge -     Cervicovaginal ancillary only( [facility]) - Nothing obvious on exam but she desires STI testing as well    Routine preventative health maintenance measures emphasized. Please refer to After Visit Summary for other counseling recommendations.   No follow-ups on file.    I spent  24  minutes dedicated to the care of this patient including pre-visit review of records, face to face time with the patient discussing her conditions and  treatments and post visit orders.    Milas Hock, MD, FACOG Obstetrician & Gynecologist, [facility], [facility]

## 2021-02-25 ENCOUNTER — Ambulatory Visit: Payer: BLUE CROSS/BLUE SHIELD | Admitting: Obstetrics and Gynecology

## 2021-02-25 LAB — CERVICOVAGINAL ANCILLARY ONLY
Bacterial Vaginitis (gardnerella): POSITIVE — AB
Candida Glabrata: NEGATIVE
Candida Vaginitis: NEGATIVE
Chlamydia: NEGATIVE
Comment: NEGATIVE
Comment: NEGATIVE
Comment: NEGATIVE
Comment: NEGATIVE
Comment: NEGATIVE
Comment: NORMAL
Neisseria Gonorrhea: NEGATIVE
Trichomonas: NEGATIVE

## 2021-02-27 MED ORDER — METRONIDAZOLE 500 MG PO TABS: 500.0000 mg | ORAL_TABLET | Freq: Two times a day (BID) | ORAL | 0 refills | Status: AC

## 2021-02-27 NOTE — Addendum Note (Signed)
Addended by: Milas Hock A on: 02/27/2021 01:42 PM   Modules accepted: Orders

## 2021-02-28 ENCOUNTER — Telehealth: Payer: Self-pay | Admitting: *Deleted

## 2021-02-28 ENCOUNTER — Encounter: Payer: Self-pay | Admitting: Family Medicine

## 2021-02-28 NOTE — Telephone Encounter (Signed)
Pt aware of positive BV on Aptima

## 2021-03-01 ENCOUNTER — Other Ambulatory Visit: Payer: Self-pay | Admitting: Obstetrics and Gynecology

## 2021-03-01 DIAGNOSIS — A6004 Herpesviral vulvovaginitis: Secondary | ICD-10-CM

## 2021-03-01 LAB — HERPES SIMPLEX VIRUS CULTURE
MICRO NUMBER:: 12412874
SPECIMEN QUALITY:: ADEQUATE

## 2021-03-01 MED ORDER — VALACYCLOVIR HCL 500 MG PO TABS: 500.0000 mg | ORAL_TABLET | Freq: Every day | ORAL | 12 refills | Status: DC

## 2021-03-01 MED ORDER — VALACYCLOVIR HCL 1 G PO TABS: 1000.0000 mg | ORAL_TABLET | Freq: Every day | ORAL | 2 refills | Status: DC

## 2021-03-30 ENCOUNTER — Other Ambulatory Visit: Payer: Self-pay

## 2021-03-30 ENCOUNTER — Ambulatory Visit (INDEPENDENT_AMBULATORY_CARE_PROVIDER_SITE_OTHER): Payer: BLUE CROSS/BLUE SHIELD

## 2021-03-30 ENCOUNTER — Other Ambulatory Visit (HOSPITAL_COMMUNITY)
Admission: RE | Admit: 2021-03-30 | Discharge: 2021-03-30 | Disposition: A | Discharge status: Home / Self Care | Payer: BLUE CROSS/BLUE SHIELD | Source: Ambulatory Visit

## 2021-03-30 VITALS — BP 93/58 | HR 64 | Resp 16 | Ht 63.0 in | Wt 114.0 lb

## 2021-03-30 DIAGNOSIS — N898 Other specified noninflammatory disorders of vagina: Secondary | ICD-10-CM

## 2021-03-30 DIAGNOSIS — R309 Painful micturition, unspecified: Secondary | ICD-10-CM

## 2021-03-30 LAB — POCT URINALYSIS DIPSTICK
Appearance: NORMAL
Bilirubin, UA: NEGATIVE
Blood, UA: NEGATIVE
Glucose, UA: NEGATIVE
Ketones, UA: NEGATIVE
Leukocytes, UA: NEGATIVE
Nitrite, UA: NEGATIVE
Protein, UA: NEGATIVE
Spec Grav, UA: 1.02 (ref 1.010–1.025)
Urobilinogen, UA: NEGATIVE E.U./dL — AB
pH, UA: 6 (ref 5.0–8.0)

## 2021-03-30 NOTE — Progress Notes (Signed)
History:  [name] is a 25 y.o. G1P1001 who presents to clinic today for with pain with urination. She states when she starts urinating she feels pain but that resolves almost immediately. She denies any abdominal pain, frequency or urgency. She also reports she is getting frequent BV and wants to discuss options for management.    The following portions of the patient's history were reviewed and updated as appropriate: allergies, current medications, family history, past medical history, social history, past surgical history and problem list.  Review of Systems:  Review of Systems  Constitutional: Negative.  Negative for chills and fever.  Respiratory: Negative.    Cardiovascular: Negative.  Negative for chest pain.  Genitourinary:  Positive for dysuria.  Neurological: Negative.  Negative for dizziness and headaches.     Objective:  Physical Exam BP (!) 93/58   Pulse 64   Resp 16   Ht 5\' 3"  (1.6 m)   Wt 114 lb (51.7 kg)   Breastfeeding No   BMI 20.19 kg/m  Physical Exam Vitals and nursing note reviewed.  Constitutional:      General: She is not in acute distress.    Appearance: She is well-developed.  HENT:     Head: Normocephalic.  Eyes:     Pupils: Pupils are equal, round, and reactive to light.  Cardiovascular:     Rate and Rhythm: Normal rate and regular rhythm.  Pulmonary:     Effort: Pulmonary effort is normal. No respiratory distress.     Breath sounds: Normal breath sounds.  Abdominal:     Palpations: Abdomen is soft.     Tenderness: There is no abdominal tenderness.  Genitourinary:    General: Normal vulva.     Vagina: No vaginal discharge.  Musculoskeletal:        General: Normal range of motion.     Cervical back: Normal range of motion.  Skin:    General: Skin is warm and dry.  Neurological:     Mental Status: She is alert and oriented to person, place, and time.  Psychiatric:        Behavior: Behavior normal.        Thought Content: Thought  content normal.        Judgment: Judgment normal.     Labs and Imaging Results for orders placed or performed in visit on 03/30/21 (from the past 24 hour(s))  POCT Urinalysis Dipstick     Status: Abnormal   Collection Time: 03/30/21  9:34 AM  Result Value Ref Range   Color, UA amber    Clarity, UA clear    Glucose, UA Negative Negative   Bilirubin, UA neg    Ketones, UA neg    Spec Grav, UA 1.020 1.010 - 1.025   Blood, UA neg    pH, UA 6.0 5.0 - 8.0   Protein, UA Negative Negative   Urobilinogen, UA negative (A) 0.2 or 1.0 E.U./dL   Nitrite, UA neg    Leukocytes, UA Negative Negative   Appearance normal    Odor none      Assessment & Plan:  1. Vaginal irritation - Patient reports using scented soaps and having unprotected intercourse. Discussed that both things could cause BV and encouraged patient to change soaps and try to keep track of when she notices the BV to potentially find the cause. Discussed using condoms as well as boric acid suppositories. - Cervicovaginal ancillary only( [facility])  2. Pain with urination - Normal UA, will  send for culture - External genitalia normal, no obvious signs for painful urination - Urine Culture - POCT Urinalysis Dipstick   Rolm Bookbinder, CNM 03/30/2021 9:55 AM

## 2021-03-31 ENCOUNTER — Other Ambulatory Visit: Payer: Self-pay

## 2021-03-31 DIAGNOSIS — B9689 Other specified bacterial agents as the cause of diseases classified elsewhere: Secondary | ICD-10-CM

## 2021-03-31 DIAGNOSIS — B379 Candidiasis, unspecified: Secondary | ICD-10-CM

## 2021-03-31 LAB — CERVICOVAGINAL ANCILLARY ONLY
Bacterial Vaginitis (gardnerella): POSITIVE — AB
Candida Glabrata: NEGATIVE
Candida Vaginitis: POSITIVE — AB
Chlamydia: NEGATIVE
Comment: NEGATIVE
Comment: NEGATIVE
Comment: NEGATIVE
Comment: NEGATIVE
Comment: NEGATIVE
Comment: NORMAL
Neisseria Gonorrhea: NEGATIVE
Trichomonas: NEGATIVE

## 2021-03-31 LAB — URINE CULTURE
MICRO NUMBER:: 12554162
Result:: NO GROWTH
SPECIMEN QUALITY:: ADEQUATE

## 2021-03-31 MED ORDER — METRONIDAZOLE 500 MG PO TABS: 500.0000 mg | ORAL_TABLET | Freq: Two times a day (BID) | ORAL | 0 refills | Status: DC

## 2021-03-31 MED ORDER — FLUCONAZOLE 150 MG PO TABS
150.0000 mg | ORAL_TABLET | Freq: Once | ORAL | 0 refills | Status: AC
Start: 2021-03-31 — End: 2021-03-31

## 2021-03-31 NOTE — Progress Notes (Signed)
Pt positive for BV and yeast. Rx for Flagyl and Rx for Diflucan sent per protocol. Pt was made aware of Dx and Rx through MyChart.
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
## 2021-06-29 ENCOUNTER — Encounter: Payer: Self-pay | Admitting: Family Medicine

## 2021-07-08 ENCOUNTER — Ambulatory Visit (INDEPENDENT_AMBULATORY_CARE_PROVIDER_SITE_OTHER): Payer: BLUE CROSS/BLUE SHIELD | Admitting: Physician Assistant

## 2021-07-08 ENCOUNTER — Encounter: Payer: Self-pay | Admitting: Physician Assistant

## 2021-07-08 ENCOUNTER — Other Ambulatory Visit: Payer: Self-pay

## 2021-07-08 VITALS — BP 110/62 | HR 90 | Ht 63.0 in | Wt 121.0 lb

## 2021-07-08 DIAGNOSIS — K625 Hemorrhage of anus and rectum: Secondary | ICD-10-CM | POA: Insufficient documentation

## 2021-07-08 DIAGNOSIS — K5901 Slow transit constipation: Secondary | ICD-10-CM | POA: Insufficient documentation

## 2021-07-08 DIAGNOSIS — K648 Other hemorrhoids: Secondary | ICD-10-CM | POA: Insufficient documentation

## 2021-07-08 MED ORDER — HYDROCORTISONE ACETATE 25 MG RE SUPP: 25.0000 mg | Freq: Two times a day (BID) | RECTAL | 0 refills | Status: DC | PRN

## 2021-07-08 NOTE — Progress Notes (Signed)
° °  Subjective:    Patient ID: Sheila [name], female    DOB: 08-Aug-1995, 26 y.o.   MRN: [id number]  HPI Pt is a 26 yo female who presents to the clinic with 1 week of bright red blood in stool. No hx of rectal bleeding. No hx of hemorrhoids. No family hx of colon cancer.  She is 1 year post partum. No abdominal pain. She has noticed her BM are harder and not daily. She denies any known diet changes and not on any medication. No fever, chills, body aches.    .. 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   Lab test positive for detection of COVID-19 virus 09/24/2019   Bright red blood per rectum 07/08/2021   Internal hemorrhoids 07/08/2021   Slow transit constipation 07/08/2021   Resolved Ambulatory Problems    Diagnosis Date Noted   Cough 05/07/2006   CHEST PAIN, ACUTE 05/07/2006   Right shoulder strain 12/19/2013   Screening for sickle-cell disease or trait 12/22/2015   Need for Menactra vaccination 12/22/2015   Screening for deficiency anemia 12/22/2015   Chest trauma 02/25/2016   Supervision of normal first pregnancy 03/13/2019   Marginal insertion of umbilical cord affecting management of mother 06/10/2019   GBS bacteriuria 09/02/2019   Post-dates pregnancy 10/01/2019   Past Medical History:  Diagnosis Date   Asthma    HSV (herpes simplex virus) infection       Review of Systems    See HPI.  Objective:   Physical Exam Vitals reviewed.  Constitutional:      Appearance: Normal appearance.  HENT:     Head: Normocephalic.  Cardiovascular:     Rate and Rhythm: Normal rate.  Pulmonary:     Effort: Pulmonary effort is normal.  Abdominal:     General: There is no distension.     Palpations: Abdomen is soft. There is no mass.     Tenderness: There is no abdominal tenderness. There is no right CVA tenderness, left CVA tenderness, guarding or rebound.     Hernia: No  hernia is present.  Genitourinary:    General: Normal vulva.     Rectum: Guaiac result positive.     Comments: No external hemorrhoids noted. Palpated internal hemorrhoids.  Neurological:     General: No focal deficit present.     Mental Status: She is alert.  Psychiatric:        Mood and Affect: Mood normal.          Assessment & Plan:  Marland KitchenMarland KitchenKorinna was seen today for rectal bleeding.  Diagnoses and all orders for this visit:  Bright red blood per rectum  Internal hemorrhoids -     hydrocortisone (ANUSOL-HC) 25 MG suppository; Place 1 suppository (25 mg total) rectally 2 (two) times daily as needed for hemorrhoids.  Slow transit constipation   Likely bleeding is from hemorrhoids no major colon cancer risk factors and patient is very young.  Start colace and/or miralax for constipation. Use anusol bid for 7 days for internal hemorrhoids.  Follow upin 2-4 weeks to make sure symptoms are improving.

## 2021-07-08 NOTE — Patient Instructions (Addendum)
Start colace daily if not changing stool then miralax 1 capful in 4oz of juice up to twice a day until stools are soft and frequent  Use Anusol twice a day for 7 days then as needed for bleeding and hemorrhoids  Constipation, Adult Constipation is when a person has fewer than three bowel movements in a week, has difficulty having a bowel movement, or has stools (feces) that are dry, hard, or larger than normal. Constipation may be caused by an underlying condition. It may become worse with age if a person takes certain medicines and does not take in enough fluids. Follow these instructions at home: Eating and drinking  Eat foods that have a lot of fiber, such as beans, whole grains, and fresh fruits and vegetables. Limit foods that are low in fiber and high in fat and processed sugars, such as fried or sweet foods. These include french fries, hamburgers, cookies, candies, and soda. Drink enough fluid to keep your urine pale yellow. General instructions Exercise regularly or as told by your health care provider. Try to do 150 minutes of moderate exercise each week. Use the bathroom when you have the urge to go. Do not hold it in. Take over-the-counter and prescription medicines only as told by your health care provider. This includes any fiber supplements. During bowel movements: Practice deep breathing while relaxing the lower abdomen. Practice pelvic floor relaxation. Watch your condition for any changes. Let your health care provider know about them. Keep all follow-up visits as told by your health care provider. This is important. Contact a health care provider if: You have pain that gets worse. You have a fever. You do not have a bowel movement after 4 days. You vomit. You are not hungry or you lose weight. You are bleeding from the opening between the buttocks (anus). You have thin, pencil-like stools. Get help right away if: You have a fever and your symptoms suddenly get  worse. You leak stool or have blood in your stool. Your abdomen is bloated. You have severe pain in your abdomen. You feel dizzy or you faint. Summary Constipation is when a person has fewer than three bowel movements in a week, has difficulty having a bowel movement, or has stools (feces) that are dry, hard, or larger than normal. Eat foods that have a lot of fiber, such as beans, whole grains, and fresh fruits and vegetables. Drink enough fluid to keep your urine pale yellow. Take over-the-counter and prescription medicines only as told by your health care provider. This includes any fiber supplements. This information is not intended to replace advice given to you by your health care provider. Make sure you discuss any questions you have with your health care provider. Document Revised: 04/09/2019 Document Reviewed: 04/09/2019 Elsevier Patient Education  2022 Elsevier Inc. Hemorrhoids Hemorrhoids are swollen veins that may develop: In the butt (rectum). These are called internal hemorrhoids. Around the opening of the butt (anus). These are called external hemorrhoids. Hemorrhoids can cause pain, itching, or bleeding. Most of the time, they do not cause serious problems. They usually get better with diet changes, lifestyle changes, and other home treatments. What are the causes? This condition may be caused by: Having trouble pooping (constipation). Pushing hard (straining) to poop. Watery poop (diarrhea). Pregnancy. Being very overweight (obese). Sitting for long periods of time. Heavy lifting or other activity that causes you to strain. Anal sex. Riding a bike for a long period of time. What are the signs or symptoms? Symptoms  of this condition include: Pain. Itching or soreness in the butt. Bleeding from the butt. Leaking poop. Swelling in the area. One or more lumps around the opening of your butt. How is this diagnosed? A doctor can often diagnose this condition by  looking at the affected area. The doctor may also: Do an exam that involves feeling the area with a gloved hand (digital rectal exam). Examine the area inside your butt using a small tube (anoscope). Order blood tests. This may be done if you have lost a lot of blood. Have you get a test that involves looking inside the colon using a flexible tube with a camera on the end (sigmoidoscopy or colonoscopy). How is this treated? This condition can usually be treated at home. Your doctor may tell you to change what you eat, make lifestyle changes, or try home treatments. If these do not help, procedures can be done to remove the hemorrhoids or make them smaller. These may involve: Placing rubber bands at the base of the hemorrhoids to cut off their blood supply. Injecting medicine into the hemorrhoids to shrink them. Shining a type of light energy onto the hemorrhoids to cause them to fall off. Doing surgery to remove the hemorrhoids or cut off their blood supply. Follow these instructions at home: Eating and drinking  Eat foods that have a lot of fiber in them. These include whole grains, beans, nuts, fruits, and vegetables. Ask your doctor about taking products that have added fiber (fibersupplements). Reduce the amount of fat in your diet. You can do this by: Eating low-fat dairy products. Eating less red meat. Avoiding processed foods. Drink enough fluid to keep your pee (urine) pale yellow. Managing pain and swelling  Take a warm-water bath (sitz bath) for 20 minutes to ease pain. Do this 3-4 times a day. You may do this in a bathtub or using a portable sitz bath that fits over the toilet. If told, put ice on the painful area. It may be helpful to use ice between your warm baths. Put ice in a plastic bag. Place a towel between your skin and the bag. Leave the ice on for 20 minutes, 2-3 times a day. General instructions Take over-the-counter and prescription medicines only as told by your  doctor. Medicated creams and medicines may be used as told. Exercise often. Ask your doctor how much and what kind of exercise is best for you. Go to the bathroom when you have the urge to poop. Do not wait. Avoid pushing too hard when you poop. Keep your butt dry and clean. Use wet toilet paper or moist towelettes after pooping. Do not sit on the toilet for a long time. Keep all follow-up visits as told by your doctor. This is important. Contact a doctor if you: Have pain and swelling that do not get better with treatment or medicine. Have trouble pooping. Cannot poop. Have pain or swelling outside the area of the hemorrhoids. Get help right away if you have: Bleeding that will not stop. Summary Hemorrhoids are swollen veins in the butt or around the opening of the butt. They can cause pain, itching, or bleeding. Eat foods that have a lot of fiber in them. These include whole grains, beans, nuts, fruits, and vegetables. Take a warm-water bath (sitz bath) for 20 minutes to ease pain. Do this 3-4 times a day. This information is not intended to replace advice given to you by your health care provider. Make sure you discuss any questions you  have with your health care provider. Document Revised: 12/01/2020 Document Reviewed: 12/01/2020 Elsevier Patient Education  2022 [employer].

## 2021-07-11 ENCOUNTER — Encounter: Payer: Self-pay | Admitting: Family Medicine

## 2021-07-25 ENCOUNTER — Ambulatory Visit (INDEPENDENT_AMBULATORY_CARE_PROVIDER_SITE_OTHER): Payer: Self-pay

## 2021-07-25 ENCOUNTER — Ambulatory Visit (INDEPENDENT_AMBULATORY_CARE_PROVIDER_SITE_OTHER): Payer: BLUE CROSS/BLUE SHIELD | Admitting: Sports Medicine

## 2021-07-25 ENCOUNTER — Other Ambulatory Visit: Payer: Self-pay

## 2021-07-25 DIAGNOSIS — M79641 Pain in right hand: Secondary | ICD-10-CM

## 2021-07-25 DIAGNOSIS — M79642 Pain in left hand: Secondary | ICD-10-CM

## 2021-07-25 DIAGNOSIS — M79645 Pain in left finger(s): Secondary | ICD-10-CM | POA: Diagnosis not present

## 2021-07-25 DIAGNOSIS — M79644 Pain in right finger(s): Secondary | ICD-10-CM | POA: Diagnosis not present

## 2021-07-25 DIAGNOSIS — R202 Paresthesia of skin: Secondary | ICD-10-CM | POA: Diagnosis not present

## 2021-07-25 MED ORDER — MELOXICAM 15 MG PO TABS: ORAL_TABLET | ORAL | 3 refills | Status: DC

## 2021-07-25 NOTE — Assessment & Plan Note (Signed)
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 Occasional paresthesias but these are only minimal. On exam she has tenderness at the thumb basal joint bilaterally, negative Finkelstein sign bilaterally, negative Tinel's and Phalen signs. Start conservatively, x-rays, home PT, meloxicam. Return to see me in 4 to 6 weeks, MRI if no better.

## 2021-07-25 NOTE — Progress Notes (Signed)
° ° °  Procedures performed today:    None.  Independent interpretation of notes and tests performed by another provider:   None.  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 Occasional paresthesias but these are only minimal. On exam she has tenderness at the thumb basal joint bilaterally, negative Finkelstein sign bilaterally, negative Tinel's and Phalen signs. Start conservatively, x-rays, home PT, meloxicam. Return to see me in 4 to 6 weeks, MRI if no better.  Chronic process with exacerbation and pharmacologic intervention  ___________________________________________ Ihor Austin. Benjamin Stain, M.D., ABFM., CAQSM. Primary Care and Sports Medicine [facility]  Adjunct Instructor of Family Medicine  [employer]

## 2021-09-12 ENCOUNTER — Ambulatory Visit: Payer: BLUE CROSS/BLUE SHIELD | Admitting: Sports Medicine

## 2021-09-22 ENCOUNTER — Encounter: Payer: Self-pay | Admitting: *Deleted

## 2021-11-22 ENCOUNTER — Ambulatory Visit (INDEPENDENT_AMBULATORY_CARE_PROVIDER_SITE_OTHER): Payer: BC Managed Care – PPO | Admitting: *Deleted

## 2021-11-22 VITALS — BP 114/70 | HR 64 | Temp 99.0°F | Ht 62.5 in | Wt 121.0 lb

## 2021-11-22 DIAGNOSIS — N3001 Acute cystitis with hematuria: Secondary | ICD-10-CM

## 2021-11-22 LAB — POCT URINALYSIS DIPSTICK
Bilirubin, UA: NEGATIVE
Glucose, UA: NEGATIVE
Ketones, UA: NEGATIVE
Nitrite, UA: POSITIVE
Protein, UA: NEGATIVE
Spec Grav, UA: 1.01 (ref 1.010–1.025)
Urobilinogen, UA: NEGATIVE E.U./dL — AB
pH, UA: 6.5 (ref 5.0–8.0)

## 2021-11-22 MED ORDER — NITROFURANTOIN MONOHYD MACRO 100 MG PO CAPS
100.0000 mg | ORAL_CAPSULE | Freq: Two times a day (BID) | ORAL | 1 refills | Status: DC
Start: 2021-11-22 — End: 2022-09-07

## 2021-11-22 NOTE — Progress Notes (Cosign Needed)
Pt here for nurse visit only.  She has c/o's painful urination x 2days.  Urinalysis is positive for nitrites and leukocytes.  Per protocol Macrobid sent to her pharmacy and urine culture sent to pharmacy.

## 2021-11-25 LAB — URINE CULTURE
MICRO NUMBER:: 13548209
SPECIMEN QUALITY:: ADEQUATE

## 2022-03-23 ENCOUNTER — Encounter: Payer: Self-pay | Admitting: Family Medicine

## 2022-03-24 DIAGNOSIS — J040 Acute laryngitis: Secondary | ICD-10-CM | POA: Diagnosis not present

## 2022-03-24 DIAGNOSIS — R059 Cough, unspecified: Secondary | ICD-10-CM | POA: Diagnosis not present

## 2022-04-20 ENCOUNTER — Ambulatory Visit: Payer: BC Managed Care – PPO | Admitting: Obstetrics and Gynecology

## 2022-04-20 NOTE — Progress Notes (Deleted)
   GYNECOLOGY OFFICE VISIT NOTE  History:   Sheila [name] is a 26 y.o. G1P1001 here today for IUD check. Her strings previously were not seen but IUD was confirmed in the cavity by bedside US. It was originally placed 10/2019.   She reports ***.   She denies any abnormal vaginal discharge, bleeding, pelvic pain or other concerns.     Past Medical History:  Diagnosis Date   Asthma    HSV (herpes simplex virus) infection     Past Surgical History:  Procedure Laterality Date   NO PAST SURGERIES      The following portions of the patient's history were reviewed and updated as appropriate: allergies, current medications, past family history, past medical history, past social history, past surgical history and problem list.   Health Maintenance:   Pap normal 03/2019.   Review of Systems:  Pertinent items noted in HPI and remainder of comprehensive ROS otherwise negative.  Physical Exam:  There were no vitals taken for this visit. CONSTITUTIONAL: Well-developed, well-nourished female in no acute distress.  HEENT:  Normocephalic, atraumatic. External right and left ear normal. No scleral icterus.  NECK: Normal range of motion, supple, no masses noted on observation SKIN: No rash noted. Not diaphoretic. No erythema. No pallor. MUSCULOSKELETAL: Normal range of motion. No edema noted. NEUROLOGIC: Alert and oriented to person, place, and time. Normal muscle tone coordination. No cranial nerve deficit noted. PSYCHIATRIC: Normal mood and affect. Normal behavior. Normal judgment and thought content.   PELVIC: {Blank single:19197::"Deferred","Normal appearing external genitalia; normal urethral meatus; normal appearing vaginal mucosa and cervix.  No abnormal discharge noted.  Normal uterine size, no other palpable masses, no uterine or adnexal tenderness. Performed in the presence of a chaperone"}  Labs and Imaging No results found for this or any previous visit (from the past 168  hour(s)). No results found.  Assessment and Plan:   1. IUD check up ***    Diagnoses and all orders for this visit:  IUD check up    Routine preventative health maintenance measures emphasized. Please refer to After Visit Summary for other counseling recommendations.   No follow-ups on file.  Milas Hock, MD, FACOG Obstetrician & Gynecologist, [facility] for [facility], [facility]

## 2022-05-02 ENCOUNTER — Encounter: Payer: Self-pay | Admitting: Family Medicine

## 2022-07-14 ENCOUNTER — Other Ambulatory Visit: Payer: Self-pay | Admitting: Obstetrics and Gynecology

## 2022-07-14 DIAGNOSIS — A6004 Herpesviral vulvovaginitis: Secondary | ICD-10-CM

## 2022-07-27 ENCOUNTER — Ambulatory Visit: Payer: BC Managed Care – PPO | Admitting: Family Medicine

## 2022-08-10 ENCOUNTER — Encounter: Payer: Self-pay | Admitting: Sports Medicine

## 2022-08-10 ENCOUNTER — Ambulatory Visit (INDEPENDENT_AMBULATORY_CARE_PROVIDER_SITE_OTHER): Payer: BC Managed Care – PPO | Admitting: Sports Medicine

## 2022-08-10 VITALS — BP 103/68 | HR 60 | Temp 97.8°F

## 2022-08-10 DIAGNOSIS — J101 Influenza due to other identified influenza virus with other respiratory manifestations: Secondary | ICD-10-CM

## 2022-08-10 DIAGNOSIS — B349 Viral infection, unspecified: Secondary | ICD-10-CM | POA: Diagnosis not present

## 2022-08-10 LAB — POCT INFLUENZA A/B
Influenza A, POC: NEGATIVE
Influenza B, POC: POSITIVE — AB

## 2022-08-10 LAB — POCT RAPID STREP A (OFFICE): Rapid Strep A Screen: NEGATIVE

## 2022-08-10 LAB — POC COVID19 BINAXNOW: SARS Coronavirus 2 Ag: NEGATIVE

## 2022-08-10 MED ORDER — MELOXICAM 15 MG PO TABS: ORAL_TABLET | ORAL | 3 refills | Status: DC

## 2022-08-10 NOTE — Addendum Note (Signed)
Addended by: Tarri Glenn A on: 08/10/2022 03:21 PM   Modules accepted: Orders

## 2022-08-10 NOTE — Assessment & Plan Note (Signed)
Sheila [name] is a pleasant 27 year old female, she works at a preschool, 3 days ago she developed some bodyaches, sore throat, stomach pain, fevers and chills. She has improved to some degree but he is here today for follow-up of the symptoms. Today influenza B swab is positive, COVID and strep tests are negative. She is out of the window for Tamiflu so we will treat her symptomatically and I would like to keep her out of work until Monday.

## 2022-08-10 NOTE — Progress Notes (Signed)
    Procedures performed today:    None.  Independent interpretation of notes and tests performed by another provider:   None.  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 She has improved to some degree but he is here today for follow-up of the symptoms. Today influenza B swab is positive, COVID and strep tests are negative. She is out of the window for Tamiflu so we will treat [name] symptomatically and I would like to keep [name] out of work until Monday.  I spent 30 minutes of total time managing this patient today, this includes chart review, face to face, and non-face to face time.  ____________________________________________ Sheila [name]. Sheila [name], M.D., ABFM., CAQSM., AME. Primary Care and Sports Medicine [facility]  Adjunct Professor of [place] [employer]  Risk manager

## 2022-09-07 ENCOUNTER — Ambulatory Visit (INDEPENDENT_AMBULATORY_CARE_PROVIDER_SITE_OTHER): Payer: BC Managed Care – PPO | Admitting: Family Medicine

## 2022-09-07 VITALS — BP 107/68 | HR 61 | Ht 62.0 in | Wt 123.0 lb

## 2022-09-07 DIAGNOSIS — L659 Nonscarring hair loss, unspecified: Secondary | ICD-10-CM

## 2022-09-07 MED ORDER — CLOBETASOL PROPIONATE 0.05 % EX SOLN: 1.0000 | Freq: Every evening | CUTANEOUS | 0 refills | Status: DC | PRN

## 2022-09-07 NOTE — Progress Notes (Signed)
   Acute Office Visit  Subjective:     Patient ID: Sheila [name], female    DOB: 01/03/1996, 27 y.o.   MRN: [id number]  Chief Complaint  Patient presents with   Alopecia    She reports that she noticed the spots in the top of her head a few months ago. She said that she has had this issue in high school and was given some drops to use on the area.     HPI Patient is in today for spots on the top of her head where she is losing hair.  She says sometimes the area will feel little crusty and sometimes itchy sometimes more smooth.  She had similar areas in high school and was treated with what sounds like topical steroid drops.  It did work well.  ROS      Objective:    BP 107/68   Pulse 61   Ht 5\' 2"  (1.575 m)   Wt 123 lb (55.8 kg)   SpO2 100%   BMI 22.50 kg/m    Physical Exam  No results found for any visits on 09/07/22.      Assessment & Plan:   Problem List Items Addressed This Visit   None Visit Diagnoses     Alopecia    -  Primary   Relevant Medications   clobetasol (TEMOVATE) 0.05 % external solution      Will treat with topical clobetasol external solution to be applied once a day.  I did recommend that she give a break after 2 weeks.  So 2 weeks on and 1 week off and then okay to restart if needed.  Meds ordered this encounter  Medications   clobetasol (TEMOVATE) 0.05 % external solution    Sig: Apply 1 Application topically at bedtime as needed.    Dispense:  50 mL    Refill:  0    No follow-ups on file.  Beatrice Lecher, MD

## 2022-09-11 ENCOUNTER — Encounter: Payer: Self-pay | Admitting: Family Medicine

## 2022-09-14 NOTE — Telephone Encounter (Signed)
Ok to renew letter. Thank you!

## 2022-09-15 ENCOUNTER — Encounter: Payer: Self-pay | Admitting: Family Medicine

## 2022-10-26 ENCOUNTER — Encounter: Payer: Self-pay | Admitting: Family Medicine

## 2023-02-15 ENCOUNTER — Other Ambulatory Visit (HOSPITAL_COMMUNITY)
Admission: RE | Admit: 2023-02-15 | Discharge: 2023-02-15 | Disposition: A | Discharge status: Home / Self Care | Payer: BC Managed Care – PPO | Source: Ambulatory Visit | Attending: Obstetrics and Gynecology | Admitting: Obstetrics and Gynecology

## 2023-02-15 ENCOUNTER — Ambulatory Visit (INDEPENDENT_AMBULATORY_CARE_PROVIDER_SITE_OTHER): Payer: BC Managed Care – PPO | Admitting: Obstetrics and Gynecology

## 2023-02-15 ENCOUNTER — Encounter: Payer: Self-pay | Admitting: Obstetrics and Gynecology

## 2023-02-15 VITALS — BP 113/68 | HR 68 | Ht 63.0 in | Wt 128.0 lb

## 2023-02-15 DIAGNOSIS — Z124 Encounter for screening for malignant neoplasm of cervix: Secondary | ICD-10-CM | POA: Insufficient documentation

## 2023-02-15 DIAGNOSIS — Z01411 Encounter for gynecological examination (general) (routine) with abnormal findings: Secondary | ICD-10-CM | POA: Diagnosis not present

## 2023-02-15 DIAGNOSIS — Z30431 Encounter for routine checking of intrauterine contraceptive device: Secondary | ICD-10-CM | POA: Diagnosis not present

## 2023-02-15 DIAGNOSIS — Z01419 Encounter for gynecological examination (general) (routine) without abnormal findings: Secondary | ICD-10-CM | POA: Diagnosis not present

## 2023-02-15 DIAGNOSIS — Z113 Encounter for screening for infections with a predominantly sexual mode of transmission: Secondary | ICD-10-CM | POA: Diagnosis not present

## 2023-02-15 DIAGNOSIS — A6004 Herpesviral vulvovaginitis: Secondary | ICD-10-CM

## 2023-02-15 DIAGNOSIS — N6325 Unspecified lump in the left breast, overlapping quadrants: Secondary | ICD-10-CM

## 2023-02-15 MED ORDER — VALACYCLOVIR HCL 500 MG PO TABS: 500.0000 mg | ORAL_TABLET | Freq: Every day | ORAL | 12 refills | Status: DC

## 2023-02-15 NOTE — Addendum Note (Signed)
Addended by: Kathie Dike on: 02/15/2023 03:56 PM   Modules accepted: Orders

## 2023-02-15 NOTE — Progress Notes (Signed)
GYNECOLOGY ANNUAL PREVENTATIVE CARE ENCOUNTER NOTE  Subjective:   Sheila [name] is a 27 y.o. G68P1001 female here for a annual gynecologic exam. Current complaints: IUD checkup, painful cyst under breast.    Has felt an "inflamed" lump in her left breast. Started a few months ago and was worse than it is now, can still feel it now.   Denies abnormal vaginal bleeding, discharge, pelvic pain, problems with intercourse or other gynecologic concerns. Requests STI screen.  Mirena IUD placed 10/2019, good until 10/2027.   Gynecologic History No LMP recorded. (Menstrual status: IUD). Contraception: IUD Last Pap: normal. Results: 2021 Last mammogram: n/a Gardisil: has not received  Obstetric History OB History  Gravida Para Term Preterm AB Living  1 1 1  0 0 1  SAB IAB Ectopic Multiple Live Births  0 0 0 0 1    # Outcome Date GA Lbr Len/2nd Weight Sex Type Anes PTL Lv  1 Term 10/02/19 [redacted]w[redacted]d 22:25 / 00:13 6 lb 13.2 oz (3.096 kg) F Vag-Spont EPI  LIV    Past Medical History:  Diagnosis Date   Asthma    HSV (herpes simplex virus) infection     Past Surgical History:  Procedure Laterality Date   NO PAST SURGERIES      Current Outpatient Medications on File Prior to Visit  Medication Sig Dispense Refill   clobetasol (TEMOVATE) 0.05 % external solution Apply 1 Application topically at bedtime as needed. 50 mL 0   levonorgestrel (MIRENA) 20 MCG/24HR IUD 1 each by Intrauterine route once.     meloxicam (MOBIC) 15 MG tablet One tab PO every 24 hours with a meal for 2 weeks, then once every 24 hours prn pain. 30 tablet 3   No current facility-administered medications on file prior to visit.    Allergies  Allergen Reactions   Amoxicillin Anaphylaxis    Social History   Socioeconomic History   Marital status: Single    Spouse name: Not on file   Number of children: Not on file   Years of education: Not on file   Highest education level: Associate degree: academic program   Occupational History   Occupation: Student  Tobacco Use   Smoking status: Never   Smokeless tobacco: Never  Vaping Use   Vaping status: Never Used  Substance and Sexual Activity   Alcohol use: No   Drug use: No   Sexual activity: Yes    Birth control/protection: I.U.D.  Other Topics Concern   Not on file  Social History Narrative   Track and cheerleading.    Social Determinants of Health   Financial Resource Strain: Low Risk  (09/07/2022)   Overall Financial Resource Strain (CARDIA)    Difficulty of Paying Living Expenses: Not very hard  Food Insecurity: No Food Insecurity (09/07/2022)   Hunger Vital Sign    Worried About Running Out of Food in the Last Year: Never true    Ran Out of Food in the Last Year: Never true  Transportation Needs: No Transportation Needs (09/07/2022)   PRAPARE - Administrator, Civil Service (Medical): No    Lack of Transportation (Non-Medical): No  Physical Activity: Insufficiently Active (09/07/2022)   Exercise Vital Sign    Days of Exercise per Week: 2 days    Minutes of Exercise per Session: 20 min  Stress: No Stress Concern Present (09/07/2022)   [employer] of Occupational Health - Occupational Stress Questionnaire    Feeling of Stress : Only  a little  Social Connections: Socially Isolated (09/07/2022)   Social Connection and Isolation Panel [NHANES]    Frequency of Communication with Friends and Family: Three times a week    Frequency of Social Gatherings with Friends and Family: Once a week    Attends Religious Services: Never    Database administrator or Organizations: No    Attends Engineer, structural: Not on file    Marital Status: Never married  Intimate Partner Violence: Unknown (09/09/2021)   Received from [employer], [facility]   HITS    Physically Hurt: Not on file    Insult or Talk Down To: Not on file    Threaten Physical Harm: Not on file    Scream or Curse: Not on file    Family History  Problem  Relation Age of Onset   Hypothyroidism Mother    Hyperlipidemia Father     The following portions of the patient's history were reviewed and updated as appropriate: allergies, current medications, past family history, past medical history, past social history, past surgical history and problem list.  Review of Systems Pertinent items are noted in HPI.   Objective:  BP 113/68   Pulse 68   Ht 5\' 3"  (1.6 m)   Wt 128 lb (58.1 kg)   BMI 22.67 kg/m  CONSTITUTIONAL: Well-developed, well-nourished female in no acute distress.  HENT:  Normocephalic, atraumatic, External right and left ear normal. Oropharynx is clear and moist EYES: Conjunctivae and EOM are normal. Pupils are equal, round, and reactive to light. No scleral icterus.  NECK: Normal range of motion, supple, no masses.  Normal thyroid.  SKIN: Skin is warm and dry. No rash noted. Not diaphoretic. No erythema. No pallor. NEUROLOGIC: Alert and oriented to person, place, and time. Normal reflexes, muscle tone coordination. No cranial nerve deficit noted. PSYCHIATRIC: Normal mood and affect. Normal behavior. Normal judgment and thought content. CARDIOVASCULAR: Normal heart rate noted RESPIRATORY: Effort normal, no problems with respiration noted. BREASTS: Symmetric in size. 1.5 cm mildly tender, mobile lump in breast palpable at 6 o'clock inferior to areola in left breast. No other masses, skin changes, nipple drainage, or lymphadenopathy. ABDOMEN: Soft, no distention noted.  No tenderness, rebound or guarding.  PELVIC: Normal appearing external genitalia; normal appearing vaginal mucosa and cervix.  IUD strings seen at os. No abnormal discharge noted.  Pap smear obtained. Pelvic cultures obtained. Normal uterine size, no other palpable masses, no uterine or adnexal tenderness. MUSCULOSKELETAL: Normal range of motion. No tenderness.  No cyanosis, clubbing, or edema.   Exam done with chaperone present.  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 Can increase to twice a day for 5 days in the event of a recurrence  Dispense: 30 tablet; Refill: 12  2. Well woman exam Healthy female exam  3. Cervical cancer screening - Cytology - PAP( [place])  4. IUD check up Strings seen at os Due for change 10/2027  5. Routine screening for STI (sexually transmitted infection) - Hepatitis B surface antigen - Hepatitis C antibody - RPR - HIV Antibody (routine testing w rflx) - Cervicovaginal ancillary only  6. Mass overlapping multiple quadrants of left breast Lump palpable in left breast, 2-3 months - [place] Unlisted Procedure Breast; Future  Will follow up results of pap smear/STI screen and manage accordingly. Encouraged improvement in diet  and exercise.    Routine preventative health maintenance measures emphasized. Please refer to After Visit Summary for other counseling recommendations.     Baldemar Lenis, MD, [facility] Attending Center for [employer] [facility])

## 2023-02-16 LAB — CERVICOVAGINAL ANCILLARY ONLY
Chlamydia: POSITIVE — AB
Comment: NEGATIVE
Comment: NEGATIVE
Comment: NORMAL
Neisseria Gonorrhea: NEGATIVE
Trichomonas: NEGATIVE

## 2023-02-18 ENCOUNTER — Other Ambulatory Visit: Payer: Self-pay | Admitting: Family Medicine

## 2023-02-18 MED ORDER — AZITHROMYCIN 500 MG PO TABS: 1000.0000 mg | ORAL_TABLET | Freq: Once | ORAL | 1 refills | Status: AC

## 2023-02-19 ENCOUNTER — Encounter: Payer: Self-pay | Admitting: Obstetrics and Gynecology

## 2023-02-23 LAB — CYTOLOGY - PAP
Diagnosis: NEGATIVE
Diagnosis: REACTIVE

## 2023-04-23 ENCOUNTER — Ambulatory Visit
Admission: RE | Admit: 2023-04-23 | Discharge: 2023-04-23 | Disposition: A | Discharge status: Home / Self Care | Payer: BC Managed Care – PPO | Source: Ambulatory Visit | Attending: Obstetrics and Gynecology | Admitting: Obstetrics and Gynecology

## 2023-04-23 DIAGNOSIS — N6325 Unspecified lump in the left breast, overlapping quadrants: Secondary | ICD-10-CM

## 2023-07-02 ENCOUNTER — Ambulatory Visit (INDEPENDENT_AMBULATORY_CARE_PROVIDER_SITE_OTHER): Payer: BC Managed Care – PPO | Admitting: Family Medicine

## 2023-07-02 ENCOUNTER — Encounter: Payer: Self-pay | Admitting: Family Medicine

## 2023-07-02 VITALS — BP 100/64 | HR 92 | Temp 98.7°F | Resp 16 | Ht 62.5 in | Wt 123.0 lb

## 2023-07-02 DIAGNOSIS — J0100 Acute maxillary sinusitis, unspecified: Secondary | ICD-10-CM | POA: Diagnosis not present

## 2023-07-02 DIAGNOSIS — R051 Acute cough: Secondary | ICD-10-CM

## 2023-07-02 MED ORDER — AZITHROMYCIN 250 MG PO TABS
ORAL_TABLET | ORAL | 0 refills | Status: AC
Start: 2023-07-02 — End: 2023-07-07

## 2023-07-02 MED ORDER — PREDNISONE 10 MG PO TABS: 10.0000 mg | ORAL_TABLET | Freq: Two times a day (BID) | ORAL | 0 refills | Status: DC

## 2023-07-02 NOTE — Progress Notes (Signed)
Acute Office Visit  Subjective:     Patient ID: Sheila [name], female    DOB: Jun 28, 1995, 28 y.o.   MRN: [id number]  Chief Complaint  Patient presents with   Cough    Daughter with flu last week    HPI Patient is in today for flu-like symptoms after daughter getting diagnosed with influenza A on 06/18/23.  Symptoms started on 06/21/23, symptoms have improved, continues to feel fatigue, cough. Shortness of breath with talking and exertion.  No fever. Night sweats have awakened her. Left ear pain/irritation.  Taking Robitussin, thera flu, Nyquil, Dayquil, Honey.  Not in distress. No obvious shortness of breath.      Review of Systems  Constitutional:  Positive for chills and malaise/fatigue. Negative for fever.  HENT:  Positive for congestion and sinus pain.   Respiratory:  Positive for cough and shortness of breath.   Neurological:  Positive for headaches.        Objective:    BP 100/64 (BP Location: Left Arm, Patient Position: Sitting, Cuff Size: Normal)   Pulse 92   Temp 98.7 F (37.1 C) (Oral)   Resp 16   Ht 5' 2.5" (1.588 m)   Wt 123 lb (55.8 kg)   SpO2 100%   BMI 22.14 kg/m  BP Readings from Last 3 Encounters:  07/02/23 100/64  02/15/23 113/68  09/07/22 107/68      Physical Exam HENT:     Right Ear: Tympanic membrane normal.     Left Ear: There is impacted cerumen.     Nose:     Right Sinus: Maxillary sinus tenderness present. No frontal sinus tenderness.     Left Sinus: Maxillary sinus tenderness present. No frontal sinus tenderness.     No results found for any visits on 07/02/23.      Assessment & Plan:   Problem List Items Addressed This Visit     Acute cough   Prednisone 10 mg BID with food for next 5 days. Adequate hydration. Elevate to sleep. Humidify air. Explained cough can last longer than illness. Lungs clear. No shortness of breath. Oxygen saturation 100% in office today.  Follow-up as needed with PCP.        Relevant  Medications   predniSONE (DELTASONE) 10 MG tablet   Acute non-recurrent maxillary sinusitis - Primary   Symptoms present since 06/21/23. Continues to have fatigue, cough, and sinus pain and pressure with occasional left ear pain, symptoms consistent with acute sinusitis. Azithromycin 500 mg today, followed by 250 mg daily for next 4 days. Sinus rinses. Supportive therapy to include adequate hydration and rest. Follow-up with PCP if symptoms do not resolve.       Relevant Medications   azithromycin (ZITHROMAX) 250 MG tablet   predniSONE (DELTASONE) 10 MG tablet    Meds ordered this encounter  Medications   azithromycin (ZITHROMAX) 250 MG tablet    Sig: Take 2 tablets by mouth today, followed by one tablet by mouth for next 4 days    Dispense:  6 tablet    Refill:  0    Supervising Provider:   Suzan Slick [[id number]]   predniSONE (DELTASONE) 10 MG tablet    Sig: Take 1 tablet (10 mg total) by mouth 2 (two) times daily with a meal.    Dispense:  10 tablet    Refill:  0    Supervising Provider:   Suzan Slick [phone]  Agrees with plan of care discussed.  Questions answered.  Return if symptoms worsen or fail to improve.  Novella Olive, FNP

## 2023-07-02 NOTE — Assessment & Plan Note (Signed)
Prednisone 10 mg BID with food for next 5 days. Adequate hydration. Elevate to sleep. Humidify air. Explained cough can last longer than illness. Lungs clear. No shortness of breath. Oxygen saturation 100% in office today.  Follow-up as needed with PCP.

## 2023-07-02 NOTE — Assessment & Plan Note (Addendum)
Symptoms present since 06/21/23. Continues to have fatigue, cough, and sinus pain and pressure with occasional left ear pain, symptoms consistent with acute sinusitis. Azithromycin 500 mg today, followed by 250 mg daily for next 4 days. Sinus rinses. Supportive therapy to include adequate hydration and rest. Follow-up with PCP if symptoms do not resolve.

## 2023-07-03 ENCOUNTER — Encounter: Payer: Self-pay | Admitting: Family Medicine

## 2023-07-05 ENCOUNTER — Encounter: Payer: Self-pay | Admitting: Family Medicine

## 2023-07-12 ENCOUNTER — Ambulatory Visit: Payer: BC Managed Care – PPO

## 2023-07-12 ENCOUNTER — Ambulatory Visit (INDEPENDENT_AMBULATORY_CARE_PROVIDER_SITE_OTHER): Payer: BC Managed Care – PPO | Admitting: Family Medicine

## 2023-07-12 VITALS — BP 105/64 | HR 73 | Ht 62.5 in | Wt 128.0 lb

## 2023-07-12 DIAGNOSIS — R052 Subacute cough: Secondary | ICD-10-CM

## 2023-07-12 DIAGNOSIS — R0789 Other chest pain: Secondary | ICD-10-CM

## 2023-07-12 DIAGNOSIS — J45990 Exercise induced bronchospasm: Secondary | ICD-10-CM | POA: Diagnosis not present

## 2023-07-12 DIAGNOSIS — R079 Chest pain, unspecified: Secondary | ICD-10-CM | POA: Diagnosis not present

## 2023-07-12 MED ORDER — ALBUTEROL SULFATE HFA 108 (90 BASE) MCG/ACT IN AERS: 2.0000 | INHALATION_SPRAY | Freq: Four times a day (QID) | RESPIRATORY_TRACT | 1 refills | Status: DC | PRN

## 2023-07-12 NOTE — Progress Notes (Signed)
 Acute Office Visit  Subjective:     Patient ID: Sheila [name], female    DOB: Jul 10, 1995, 28 y.o.   MRN: [id number]  Chief Complaint  Patient presents with   Follow-up         HPI Patient is in today for persistent cough. Started with URI about 3 weeks ago. Seen for cough with pos flu exposure and sinusitis on 1/27. Given prednisone  and Zithero and doesn't feel completely better.  She says her sinuses have completely cleared up but she is continue to have a cough and what she feels like is some tightness in the chest as well as soreness over her anterior lower ribs bilaterally it just does not quite feel right and it has been going on for 3 weeks at this point.  She has tried to be active and even start to workout again and then just has to stop because she feels exhausted.  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  She does not have a albuterol  inhaler right now.  ROS      Objective:    BP 105/64   Pulse 73   Ht 5' 2.5 (1.588 m)   Wt 128 lb (58.1 kg)   SpO2 100%   PF 400 L/min Comment: green zone  BMI 23.04 kg/m     Physical Exam Vitals and nursing note reviewed.  Constitutional:      Appearance: Normal appearance.  HENT:     Head: Normocephalic and atraumatic.     Right Ear: Tympanic membrane, ear canal and external ear normal. There is no impacted cerumen.     Left Ear: Tympanic membrane, ear canal and external ear normal. There is no impacted cerumen.     Nose: Nose normal.     Mouth/Throat:     Pharynx: Oropharynx is clear.  Eyes:     Conjunctiva/sclera: Conjunctivae normal.  Cardiovascular:     Rate and Rhythm: Normal rate and regular rhythm.  Pulmonary:     Effort: Pulmonary effort is normal.     Breath sounds: Normal breath sounds.  Musculoskeletal:     Cervical back: Neck supple. No tenderness.  Lymphadenopathy:     Cervical: No cervical adenopathy.  Skin:    General: Skin is warm and dry.  Neurological:     Mental Status: She is alert  and oriented to person, place, and time.  Psychiatric:        Mood and Affect: Mood normal.     No results found for any visits on 07/12/23.      Assessment & Plan:   Problem List Items Addressed This Visit       Respiratory   Exercise-induced asthma - Primary   Relevant Medications   albuterol  (VENTOLIN  HFA) 108 (90 Base) MCG/ACT inhaler   Other Visit Diagnoses       Subacute cough       Relevant Orders   DG Chest 2 View     Chest tightness       Relevant Orders   DG Chest 2 View      Cough with persistent symptoms-will do a peak flow today to see if in the green zone will get updated chest x-ray since been going on for 3 weeks.  Will also send over updated albuterol  inhaler so she can try that at home to see if her symptoms do improve.  Consider bacterial infection at the end of a viral infection.  Prednisone  did not seem to provide significant relief.  Meds ordered this encounter  Medications   albuterol  (VENTOLIN  HFA) 108 (90 Base) MCG/ACT inhaler    Sig: Inhale 2 puffs into the lungs every 6 (six) hours as needed for wheezing or shortness of breath.    Dispense:  8 g    Refill:  1    Return if symptoms worsen or fail to improve.  Dorothyann Byars, MD

## 2023-07-12 NOTE — Progress Notes (Signed)
 She states that she has finished the prednisone  but she still feels like there a heaviness in her back and chest like she has to work harder to take a deep breath or like her lungs are bruised.

## 2023-07-13 ENCOUNTER — Encounter: Payer: Self-pay | Admitting: Family Medicine

## 2023-07-13 NOTE — Progress Notes (Signed)
 Hi Oprah, your chest x-ray looks good as far as no sign of pneumonia.  I think you still just have a lot of inflammation in your chest after the infection.  We could consider a round of prednisone  if you would like to see if that helps, but it should gradually get better on its own.

## 2023-07-16 ENCOUNTER — Ambulatory Visit: Payer: BC Managed Care – PPO | Admitting: Family Medicine

## 2023-07-17 ENCOUNTER — Ambulatory Visit: Payer: BC Managed Care – PPO | Admitting: Family Medicine

## 2023-08-30 ENCOUNTER — Encounter: Payer: Self-pay | Admitting: Sports Medicine

## 2023-08-30 ENCOUNTER — Ambulatory Visit

## 2023-08-30 ENCOUNTER — Ambulatory Visit (INDEPENDENT_AMBULATORY_CARE_PROVIDER_SITE_OTHER): Admitting: Sports Medicine

## 2023-08-30 ENCOUNTER — Encounter: Payer: Self-pay | Admitting: Family Medicine

## 2023-08-30 DIAGNOSIS — M62838 Other muscle spasm: Secondary | ICD-10-CM | POA: Diagnosis not present

## 2023-08-30 DIAGNOSIS — G8929 Other chronic pain: Secondary | ICD-10-CM | POA: Insufficient documentation

## 2023-08-30 DIAGNOSIS — M542 Cervicalgia: Secondary | ICD-10-CM

## 2023-08-30 MED ORDER — KETOROLAC TROMETHAMINE 60 MG/2ML IM SOLN
30.0000 mg | Freq: Once | INTRAMUSCULAR | Status: AC
  Administered 2023-08-30: 30 mg via INTRAMUSCULAR

## 2023-08-30 MED ORDER — METHOCARBAMOL 500 MG PO TABS: 500.0000 mg | ORAL_TABLET | Freq: Three times a day (TID) | ORAL | 0 refills | Status: DC

## 2023-08-30 MED ORDER — PREDNISONE 50 MG PO TABS: ORAL_TABLET | ORAL | 0 refills | Status: DC

## 2023-08-30 NOTE — Assessment & Plan Note (Addendum)
 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 On exam she is sitting with her head tilted to the left. Nothing radicular, no constitutional symptoms. No progressive weakness. No trauma, no activities out of the ordinary. She has tenderness and rigidity along the right paracervical musculature from the right mastoid process down to the scapula, she is neurovascular intact distally with good strength, good motion. Normal sensation. Suspect paracervical spasm, adding x-rays, Toradol 30 intramuscular, prednisone, Robaxin. Home physical therapy given, return in 2 to 4 weeks.

## 2023-08-30 NOTE — Telephone Encounter (Signed)
 Patient scheduled.

## 2023-08-30 NOTE — Progress Notes (Signed)
    Procedures performed today:    None.  Independent interpretation of notes and tests performed by another provider:   None.  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 On exam she is sitting with her head tilted to the left. Nothing radicular, no constitutional symptoms. No progressive weakness. No trauma, no activities out of the ordinary. She has tenderness and rigidity along the right paracervical musculature from the right mastoid process down to the scapula, she is neurovascular intact distally with good strength, good motion. Normal sensation. Suspect paracervical spasm, adding x-rays, Toradol 30 intramuscular, prednisone, Robaxin. Home physical therapy given, return in 2 to 4 weeks.    ____________________________________________ Ihor Austin. Benjamin Stain, M.D., ABFM., CAQSM., AME. Primary Care and Sports Medicine [facility]  Adjunct Professor of Family Medicine  [employer]  Restaurant manager, fast food

## 2023-09-27 ENCOUNTER — Ambulatory Visit (INDEPENDENT_AMBULATORY_CARE_PROVIDER_SITE_OTHER): Admitting: Sports Medicine

## 2023-09-27 ENCOUNTER — Encounter: Payer: Self-pay | Admitting: Sports Medicine

## 2023-09-27 DIAGNOSIS — G8929 Other chronic pain: Secondary | ICD-10-CM

## 2023-09-27 DIAGNOSIS — M542 Cervicalgia: Secondary | ICD-10-CM | POA: Diagnosis not present

## 2023-09-27 MED ORDER — MELOXICAM 15 MG PO TABS: ORAL_TABLET | ORAL | 3 refills | Status: DC

## 2023-09-27 NOTE — Progress Notes (Signed)
    Procedures performed today:    None.  Independent interpretation of notes and tests performed by another provider:   None.  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 At the last visit she was sitting with her head tilted to the left. Nothing radicular, no progressive weakness, we suspected more paracervical spasm. We added prednisone , Toradol , Robaxin , x-rays, home therapy. She has improved to some degree but still has discomfort that radiates to some degree down the arm to the 2nd and 3rd fingers. No progressive weakness or constitutional symptoms. Discontinue the Robaxin , we will do meloxicam  for now, due to failure of 6 weeks of conservative treatment we will also proceed with an MRI, formal physical therapy with dry needling. Out of spotting with gymnastics. Return to see me in 4 weeks.    ____________________________________________ Joselyn Nicely. Sandy Crumb, M.D., ABFM., CAQSM., AME. Primary Care and Sports Medicine [facility]  Adjunct Professor of [facility] Medicine  [employer]  Restaurant manager, fast food

## 2023-09-27 NOTE — Assessment & Plan Note (Signed)
 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 At the last visit she was sitting with her head tilted to the left. Nothing radicular, no progressive weakness, we suspected more paracervical spasm. We added prednisone , Toradol , Robaxin , x-rays, home therapy. She has improved to some degree but still has discomfort that radiates to some degree down the arm to the 2nd and 3rd fingers. No progressive weakness or constitutional symptoms. Discontinue the Robaxin , we will do meloxicam  for now, due to failure of 6 weeks of conservative treatment we will also proceed with an MRI, formal physical therapy with dry needling. Out of spotting with gymnastics. Return to see me in 4 weeks.

## 2023-10-04 ENCOUNTER — Encounter: Payer: Self-pay | Admitting: Sports Medicine

## 2023-10-04 ENCOUNTER — Other Ambulatory Visit (HOSPITAL_COMMUNITY): Payer: Self-pay

## 2023-10-05 NOTE — Telephone Encounter (Signed)
 I have reached out to patient to confirm MRI has been scheduled. Patient states appointment with Radiology is tomorrow, 10/05/2023 at 1:30 pm.

## 2023-10-06 ENCOUNTER — Ambulatory Visit

## 2023-10-06 DIAGNOSIS — G8929 Other chronic pain: Secondary | ICD-10-CM

## 2023-10-06 DIAGNOSIS — M542 Cervicalgia: Secondary | ICD-10-CM

## 2023-10-23 ENCOUNTER — Encounter: Payer: Self-pay | Admitting: Emergency Medicine

## 2023-10-23 ENCOUNTER — Ambulatory Visit
Admission: EM | Admit: 2023-10-23 | Discharge: 2023-10-23 | Disposition: A | Discharge status: Home / Self Care | Attending: Family Medicine | Admitting: Family Medicine

## 2023-10-23 DIAGNOSIS — J020 Streptococcal pharyngitis: Secondary | ICD-10-CM | POA: Diagnosis not present

## 2023-10-23 LAB — POCT RAPID STREP A (OFFICE): Rapid Strep A Screen: POSITIVE — AB

## 2023-10-23 MED ORDER — AZITHROMYCIN 250 MG PO TABS: ORAL_TABLET | ORAL | 0 refills | Status: DC

## 2023-10-23 NOTE — Discharge Instructions (Signed)
 Take azithromycin  as directed.  Take 2 pills a day for 3 days then stop.  (You do not need to take this antibiotic for 10 days) Drink lots of fluids

## 2023-10-23 NOTE — ED Provider Notes (Signed)
 [facility]    CSN: [id number] Arrival date & time: 10/23/23  1541      History   Chief Complaint Chief Complaint  Patient presents with   Sore Throat    HPI Sheila [name] is a 27 y.o. female.   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  Also has a 59-year-old at home.  Has a severe sore throat.  Is here for evaluation.  Has had some nasal drainage and cough.  Also headache.  No fever or chills.  Past Medical History:  Diagnosis Date   Asthma    HSV (herpes simplex virus) infection     Patient Active Problem List   Diagnosis Date Noted   Chronic neck pain 08/30/2023   Bilateral hand pain 07/25/2021   Internal hemorrhoids 07/08/2021   Slow transit constipation 07/08/2021   Exercise-induced asthma 02/06/2012   Acute cough 05/07/2006    Past Surgical History:  Procedure Laterality Date   NO PAST SURGERIES      OB History     Gravida  1   Para  1   Term  1   Preterm  0   AB  0   Living  1      SAB  0   IAB  0   Ectopic  0   Multiple  0   Live Births  1            Home Medications    Prior to Admission medications   Medication Sig Start Date End Date Taking? Authorizing Provider  albuterol  (VENTOLIN  HFA) 108 (90 Base) MCG/ACT inhaler Inhale 2 puffs into the lungs every 6 (six) hours as needed for wheezing or shortness of breath. 07/12/23  Yes Cydney Draft, MD  azithromycin  (ZITHROMAX ) 250 MG tablet Take 500 mg a day for 3 days 10/23/23  Yes Stephany Ehrich, MD  levonorgestrel  (MIRENA ) 20 MCG/24HR IUD 1 each by Intrauterine route once.   Yes [provider]  valACYclovir  (VALTREX ) 500 MG tablet Take 1 tablet (500 mg total) by mouth daily. Can increase to twice a day for 5 days in the event of a recurrence 02/15/23  Yes Jan Mcgill, MD  clobetasol  (TEMOVATE ) 0.05 % external solution Apply 1 Application topically at bedtime as needed. Patient not taking: Reported on 09/27/2023 09/07/22    Cydney Draft, MD  meloxicam  (MOBIC ) 15 MG tablet One tab PO every 24 hours with a meal for 2 weeks, then once every 24 hours prn pain. 09/27/23   Gean Keels, MD    Family History Family History  Problem Relation Age of Onset   Hypothyroidism Mother    Hyperlipidemia Father     Social History Social History   Tobacco Use   Smoking status: Never   Smokeless tobacco: Never  Vaping Use   Vaping status: Never Used  Substance Use Topics   Alcohol use: No   Drug use: No     Allergies   Amoxicillin   Review of Systems Review of Systems  See HPI Physical Exam Triage Vital Signs ED Triage Vitals  Encounter Vitals Group     BP 10/23/23 1553 106/72     Systolic BP Percentile --      Diastolic BP Percentile --      Pulse Rate 10/23/23 1553 83     Resp 10/23/23 1553 18     Temp 10/23/23 1553 99.5 F (37.5 C)     Temp Source  10/23/23 1553 Oral     SpO2 10/23/23 1553 99 %     Weight 10/23/23 1555 125 lb (56.7 kg)     Height 10/23/23 1555 5\' 2"  (1.575 m)     Head Circumference --      Peak Flow --      Pain Score 10/23/23 1555 6     Pain Loc --      Pain Education --      Exclude from Growth Chart --    No data found.  Updated Vital Signs BP 106/72 (BP Location: Right Arm)   Pulse 83   Temp 99.5 F (37.5 C) (Oral)   Resp 18   Ht 5\' 2"  (1.575 m)   Wt 56.7 kg   SpO2 99%   BMI 22.86 kg/m       Physical Exam Constitutional:      General: She is not in acute distress.    Appearance: She is well-developed.  HENT:     Head: Normocephalic and atraumatic.     Right Ear: Tympanic membrane normal.     Left Ear: Tympanic membrane normal.     Nose: No congestion.     Mouth/Throat:     Pharynx: Uvula midline. Pharyngeal swelling and posterior oropharyngeal erythema present.     Tonsils: No tonsillar exudate. 1+ on the right. 1+ on the left.  Eyes:     Conjunctiva/sclera: Conjunctivae normal.     Pupils: Pupils are equal, round, and reactive to  light.  Cardiovascular:     Rate and Rhythm: Normal rate.  Pulmonary:     Effort: Pulmonary effort is normal. No respiratory distress.  Musculoskeletal:        General: Normal range of motion.     Cervical back: Normal range of motion.  Lymphadenopathy:     Cervical: Cervical adenopathy present.  Skin:    General: Skin is warm and dry.  Neurological:     Mental Status: She is alert.      UC Treatments / Results  Labs (all labs ordered are listed, but only abnormal results are displayed) Labs Reviewed  POCT RAPID STREP A (OFFICE) - Abnormal; Notable for the following components:      Result Value   Rapid Strep A Screen Positive (*)    All other components within normal limits    EKG   Radiology No results found.  Procedures Procedures (including critical care time)  Medications Ordered in UC Medications - No data to display  Initial Impression / Assessment and Plan / UC Course  I have reviewed the triage vital signs and the nursing notes.  Pertinent labs & imaging results that were available during my care of the patient were reviewed by me and considered in my medical decision making (see chart for details).     Patient has an anaphylactic reaction to amoxicillin.  Although there is documentation in the chart that she has taken Keflex  before I am not comfortable in prescribing the cephalosporin.  I am therefore giving her azithromycin  Final Clinical Impressions(s) / UC Diagnoses   Final diagnoses:  Strep throat   Discharge Instructions      Take azithromycin  as directed.  Take 2 pills a day for 3 days then stop.  (You do not need to take this antibiotic for 10 days) Drink lots of fluids   ED Prescriptions     Medication Sig Dispense Auth. Provider   azithromycin  (ZITHROMAX ) 250 MG tablet Take 500 mg a day for 3  days 6 tablet Stephany Ehrich, MD      PDMP not reviewed this encounter.   Stephany Ehrich, MD 10/23/23 1700

## 2023-10-23 NOTE — ED Triage Notes (Signed)
 Patient c/o sore throat x 5 days, nasal drainage, HA and cough.  Patient has taken Nyquil.  Afebrile.

## 2023-10-25 ENCOUNTER — Ambulatory Visit: Payer: Self-pay | Admitting: Sports Medicine

## 2023-10-25 ENCOUNTER — Encounter: Payer: Self-pay | Admitting: Sports Medicine

## 2023-10-25 ENCOUNTER — Ambulatory Visit (INDEPENDENT_AMBULATORY_CARE_PROVIDER_SITE_OTHER): Admitting: Sports Medicine

## 2023-10-25 DIAGNOSIS — M542 Cervicalgia: Secondary | ICD-10-CM

## 2023-10-25 DIAGNOSIS — G8929 Other chronic pain: Secondary | ICD-10-CM

## 2023-10-25 NOTE — Progress Notes (Signed)
    Procedures performed today:    None.  Independent interpretation of notes and tests performed by another provider:   None.  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 Prednisone , Toradol , Robaxin  and x-rays were obtained, we asked her to do some home therapy. At the last visit she had failed conservative treatment so we had recommended formal therapy, and MRI. She did get the MRI, it was read as normal. She never did the physical therapy. Today we discussed the anatomy and pathophysiology of chronic neck pain, I explained that the MRI was good at finding structural disease however functional processes could not be excluded, and physical therapy is evidence-based highly successful at treating functional neck pain. Life style stressors can also be at play here, if physical therapy is insufficiently efficacious I would like to consider neuropathic agents and medication to decrease both stress and pain such as SNRIs.    ____________________________________________ Sheila [name]. Sandy Crumb, M.D., ABFM., CAQSM., AME. Primary Care and Sports Medicine [facility]  Adjunct Professor of [facility] Medicine  [employer]  Restaurant manager, fast food

## 2023-10-25 NOTE — Assessment & Plan Note (Addendum)
 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 Prednisone , Toradol , Robaxin  and x-rays were obtained, we asked her to do some home therapy. At the last visit she had failed conservative treatment so we had recommended formal therapy, and MRI. She did get the MRI, it was read as normal. She never did the physical therapy. Today we discussed the anatomy and pathophysiology of chronic neck pain, I explained that the MRI was good at finding structural disease however functional processes could not be excluded, and physical therapy is evidence-based highly successful at treating functional neck pain. Life style stressors can also be at play here, if physical therapy is insufficiently efficacious I would like to consider neuropathic agents and medication to decrease both stress and pain such as SNRIs.

## 2023-12-31 ENCOUNTER — Ambulatory Visit

## 2023-12-31 ENCOUNTER — Other Ambulatory Visit: Payer: Self-pay

## 2023-12-31 ENCOUNTER — Ambulatory Visit
Admission: EM | Admit: 2023-12-31 | Discharge: 2023-12-31 | Disposition: A | Discharge status: Home / Self Care | Attending: Family Medicine | Admitting: Family Medicine

## 2023-12-31 DIAGNOSIS — R109 Unspecified abdominal pain: Secondary | ICD-10-CM | POA: Diagnosis not present

## 2023-12-31 LAB — POCT URINALYSIS DIP (MANUAL ENTRY)
Bilirubin, UA: NEGATIVE
Blood, UA: NEGATIVE
Glucose, UA: NEGATIVE mg/dL
Ketones, POC UA: NEGATIVE mg/dL
Leukocytes, UA: NEGATIVE
Nitrite, UA: NEGATIVE
Protein Ur, POC: NEGATIVE mg/dL
Spec Grav, UA: 1.01 (ref 1.010–1.025)
Urobilinogen, UA: 0.2 U/dL
pH, UA: 7 (ref 5.0–8.0)

## 2023-12-31 MED ORDER — NAPROXEN SODIUM 550 MG PO TABS: 550.0000 mg | ORAL_TABLET | Freq: Two times a day (BID) | ORAL | 0 refills | Status: DC

## 2023-12-31 MED ORDER — KETOROLAC TROMETHAMINE 30 MG/ML IJ SOLN
30.0000 mg | Freq: Once | INTRAMUSCULAR | Status: AC
  Administered 2023-12-31: 30 mg via INTRAMUSCULAR

## 2023-12-31 MED ORDER — TIZANIDINE HCL 4 MG PO TABS: 4.0000 mg | ORAL_TABLET | Freq: Four times a day (QID) | ORAL | 0 refills | Status: DC | PRN

## 2023-12-31 NOTE — ED Provider Notes (Signed)
 [facility]    CSN: [id number] Arrival date & time: 12/31/23  1048      History   Chief Complaint Chief Complaint  Patient presents with   Chest Pain    LT rib    HPI Sheila [name] is a 28 y.o. female.   HPI  Milagro states she had a normal day yesterday.  She woke up and when she got up out of bed she developed pain in her left flank.  It is a severe pain.  Worse with movement.  Worse with deep breath.  Unremitting.  She was able to eat a fruit bar for breakfast.  States she has had normal bowels.  No change in urination.  No history of kidney stones or kidney infection.  No sweats chills or fever.  No recent cough or cold.  No recent nausea or vomiting.  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 Authorizing Provider  naproxen  sodium (ANAPROX  DS) 550 MG tablet Take 1 tablet (550 mg total) by mouth 2 (two) times daily with a meal. 12/31/23  Yes Maranda Jamee Jacob, MD  tiZANidine  (ZANAFLEX ) 4 MG tablet Take 1-2 tablets (4-8 mg total) by mouth every 6 (six) hours as needed for muscle spasms. 12/31/23  Yes Maranda Jamee Jacob, MD  levonorgestrel  (MIRENA ) 20 MCG/24HR IUD 1 each by Intrauterine route once.    [provider]     Family History Family History  Problem Relation Age of Onset   Hypothyroidism Mother    Hyperlipidemia Father     Social History Social History   Tobacco Use   Smoking status: Never   Smokeless tobacco: Never  Vaping Use   Vaping status: Never Used  Substance Use Topics   Alcohol use: No   Drug use: No     Allergies   Amoxicillin   Review of Systems Review of Systems  See HPI Physical Exam Triage Vital Signs ED Triage Vitals  Encounter Vitals Group     BP 12/31/23 1057 119/84     Girls Systolic BP Percentile --      Girls Diastolic BP Percentile --      Boys Systolic BP Percentile --      Boys Diastolic BP Percentile --      Pulse Rate 12/31/23 1057 79     Resp 12/31/23 1057 16     Temp 12/31/23 1057 98.1 F (36.7 C)  Temp src --      SpO2 12/31/23 1057 98 %     Weight --      Height --      Head Circumference --      Peak Flow --      Pain Score 12/31/23 1100 10     Pain Loc --      Pain Education --      Exclude from Growth Chart --    No data found.  Updated Vital Signs BP 119/84   Pulse 79   Temp 98.1 F (36.7 C)   Resp 16   LMP  (LMP Unknown)   SpO2 98%       Physical Exam Constitutional:      General: She is in acute distress.     Appearance: She is well-developed. She is ill-appearing.     Comments: Acutely uncomfortable.  guarded movements  HENT:     Head: Normocephalic and atraumatic.  Eyes:     Conjunctiva/sclera: Conjunctivae normal.     Pupils: Pupils are equal, round, and reactive to light.  Cardiovascular:     Rate and Rhythm: Normal rate and regular rhythm.     Heart sounds: Normal heart sounds.  Pulmonary:     Effort: Pulmonary effort is normal. No respiratory distress.     Breath sounds: Normal breath sounds.    Chest:     Chest wall: Tenderness present.     Comments: Tenderness at the lower rib border posteriorly and towards the posterior axillary line as indicated.  No palpable defect.  No crepitus.   Lungs are clear.  Abdomen is benign. Abdominal:     General: There is no distension or abdominal bruit.     Palpations: Abdomen is soft.     Tenderness: There is no abdominal tenderness.  Musculoskeletal:        General: Normal range of motion.     Cervical back: Normal range of motion.     Comments: Calves are soft and nontender  Skin:    General: Skin is warm and dry.  Neurological:     Mental Status: She is alert.      UC Treatments / Results  Labs (all labs ordered are listed, but only abnormal results are displayed) Labs Reviewed  POCT URINALYSIS DIP (MANUAL ENTRY)    EKG   Radiology DG Abd Acute W/Chest Result Date: 12/31/2023 CLINICAL DATA:  Left flank pain. EXAM: DG ABDOMEN ACUTE WITH 1 VIEW CHEST COMPARISON:  07/12/2023. FINDINGS: Nonobstructive bowel-gas pattern. No free intraperitoneal air. No definite radiopaque calculi or other significant radiographic abnormality is seen. IUD projects over the pelvic inlet. Heart size and mediastinal contours are within normal limits. Both lungs are clear. No acute osseous abnormality. IMPRESSION: Negative abdominal radiographs.  No acute cardiopulmonary findings. Electronically Signed   By: Harrietta Sherry M.D.   On: 12/31/2023 12:02    Procedures Procedures (including critical care time)  Medications Ordered in UC Medications  ketorolac  (TORADOL ) 30 MG/ML injection 30 mg (30 mg Intramuscular Given 12/31/23 1226)    Initial Impression / Assessment and Plan / UC Course  I have reviewed the triage vital signs and the nursing notes.  Pertinent labs & imaging results that were available during my care of the patient were reviewed by me and considered in my medical decision making (see chart for details).     This appears to be acute rib pain.  I do not think that it is from abdominal history or  constipation.  I explained this is in the area of the splenic flexure.  General area of the kidney.  Urinalysis is negative.  Lungs  are clear.  No risk factors for DVT or PE. Final Clinical Impressions(s) / UC Diagnoses   Final diagnoses:  Acute left flank pain     Discharge Instructions      Take the naproxen  2 x a day with food Take the tizanidine  as needed for rest See your doctor if not improving in a few days Go to ER if worse   ED Prescriptions     Medication Sig Dispense Auth. Provider   naproxen  sodium (ANAPROX  DS) 550 MG tablet Take 1 tablet (550 mg total) by mouth 2 (two) times daily with a meal. 30 tablet Maranda Jamee Jacob, MD   tiZANidine  (ZANAFLEX ) 4 MG tablet Take 1-2 tablets (4-8 mg total) by mouth every 6 (six) hours as needed for muscle spasms. 21 tablet Maranda Jamee Jacob, MD      PDMP not reviewed this encounter.   Maranda Jamee Jacob, MD 12/31/23 1400

## 2023-12-31 NOTE — ED Triage Notes (Signed)
 Left sided rib pain, no injury. No recent illness. Took pain reliever this morning.

## 2023-12-31 NOTE — Discharge Instructions (Signed)
 Take the naproxen  2 x a day with food Take the tizanidine  as needed for rest See your doctor if not improving in a few days Go to ER if worse

## 2024-01-09 ENCOUNTER — Encounter: Payer: Self-pay | Admitting: Family Medicine

## 2024-01-09 ENCOUNTER — Ambulatory Visit (INDEPENDENT_AMBULATORY_CARE_PROVIDER_SITE_OTHER): Admitting: Family Medicine

## 2024-01-09 VITALS — BP 121/84 | HR 75 | Ht 62.5 in | Wt 129.1 lb

## 2024-01-09 DIAGNOSIS — R1012 Left upper quadrant pain: Secondary | ICD-10-CM

## 2024-01-09 DIAGNOSIS — R630 Anorexia: Secondary | ICD-10-CM | POA: Diagnosis not present

## 2024-01-09 NOTE — Progress Notes (Signed)
   Acute Office Visit  Subjective:     Patient ID: MADALYN LEGNER, female    DOB: Apr 10, 1996, 28 y.o.   MRN: [id number]  Chief Complaint  Patient presents with   Chest Pain    HPI Patient is in today for left lower rib pain.  She says it started after doing some typical housework nothing unusual no trauma no injury.  She did go to urgent care on July 28 they did do a abdominal plain film which was negative.  She rates the pain 7-8 out of 10 at its worst.  Describes it as constant and a shooting pain.  He was given an anti-inflammatory and muscle relaxer which she has taken a few times. She is also noticed decreased appetite she says she will start to eat and then not want a finish not because of increased pain just she does not feel like eating.  No change in bowel movements.  No diarrhea.  She did have a fever the first day right before she went to urgent care but no fever since then.  ROS      Objective:    BP 121/84   Pulse 75   Ht 5' 2.5 (1.588 m)   Wt 129 lb 1.9 oz (58.6 kg)   LMP 12/23/2023 (Approximate)   SpO2 100%   BMI 23.24 kg/m    Physical Exam Vitals and nursing note reviewed.  Constitutional:      Appearance: Normal appearance.  HENT:     Head: Normocephalic and atraumatic.  Eyes:     Conjunctiva/sclera: Conjunctivae normal.  Cardiovascular:     Rate and Rhythm: Normal rate and regular rhythm.  Pulmonary:     Effort: Pulmonary effort is normal.     Breath sounds: Normal breath sounds.  Abdominal:     Palpations: Abdomen is soft.     Tenderness: There is abdominal tenderness in the left upper quadrant.      Comments: She is tender in the left upper quadrant also the area between the iliac crest and the rib and she is also mildly tender directly over the lower ribs.  No rebound or guarding.  Skin:    General: Skin is warm and dry.  Neurological:     Mental Status: She is alert.  Psychiatric:        Mood and Affect: Mood normal.     No results found  for any visits on 01/09/24.      Assessment & Plan:   Problem List Items Addressed This Visit   None Visit Diagnoses       LUQ pain    -  Primary   Relevant Orders   CMP14+EGFR   Lipase   CBC with Differential/Platelet     Decreased appetite       Relevant Orders   CMP14+EGFR   Lipase   CBC with Differential/Platelet       Upper quadrant pain, left,  with decreased appetite I had like to check blood work to evaluate for mild pancreatitis.  Check CBC we did discuss that issues with the spleen could cause pain in that area especially if it is enlarged.  He has noticed decreased appetite.  But is reporting normal bowel movements so we could consider CT of the abdomen if not improving.  No orders of the defined types were placed in this encounter.   No follow-ups on file.  Dorothyann Byars, MD

## 2024-01-09 NOTE — Progress Notes (Signed)
 Pt stated that last Monday she was seen in UC for L sided rib pain  She denies any injury/trauma. She stated that the pain is 7-8/10 its constant, shooting and it doesn't matter if she is sitting or standing.

## 2024-01-10 ENCOUNTER — Ambulatory Visit: Payer: Self-pay | Admitting: Family Medicine

## 2024-01-10 LAB — CBC WITH DIFFERENTIAL/PLATELET
Basophils Absolute: 0 x10E3/uL (ref 0.0–0.2)
Basos: 0 %
EOS (ABSOLUTE): 0.1 x10E3/uL (ref 0.0–0.4)
Eos: 2 %
Hematocrit: 41.2 % (ref 34.0–46.6)
Hemoglobin: 13.2 g/dL (ref 11.1–15.9)
Immature Grans (Abs): 0 x10E3/uL (ref 0.0–0.1)
Immature Granulocytes: 0 %
Lymphocytes Absolute: 2.1 x10E3/uL (ref 0.7–3.1)
Lymphs: 44 %
MCH: 31.4 pg (ref 26.6–33.0)
MCHC: 32 g/dL (ref 31.5–35.7)
MCV: 98 fL — ABNORMAL HIGH (ref 79–97)
Monocytes Absolute: 0.6 x10E3/uL (ref 0.1–0.9)
Monocytes: 12 %
Neutrophils Absolute: 2 x10E3/uL (ref 1.4–7.0)
Neutrophils: 42 %
Platelets: 243 x10E3/uL (ref 150–450)
RBC: 4.2 x10E6/uL (ref 3.77–5.28)
RDW: 12.3 % (ref 11.7–15.4)
WBC: 4.8 x10E3/uL (ref 3.4–10.8)

## 2024-01-10 LAB — CMP14+EGFR
ALT: 22 IU/L (ref 0–32)
AST: 24 IU/L (ref 0–40)
Albumin: 4.3 g/dL (ref 4.0–5.0)
Alkaline Phosphatase: 79 IU/L (ref 44–121)
BUN/Creatinine Ratio: 15 (ref 9–23)
BUN: 12 mg/dL (ref 6–20)
Bilirubin Total: 0.7 mg/dL (ref 0.0–1.2)
CO2: 20 mmol/L (ref 20–29)
Calcium: 9.1 mg/dL (ref 8.7–10.2)
Chloride: 103 mmol/L (ref 96–106)
Creatinine, Ser: 0.8 mg/dL (ref 0.57–1.00)
Globulin, Total: 3.2 g/dL (ref 1.5–4.5)
Glucose: 73 mg/dL (ref 70–99)
Potassium: 4.4 mmol/L (ref 3.5–5.2)
Sodium: 139 mmol/L (ref 134–144)
Total Protein: 7.5 g/dL (ref 6.0–8.5)
eGFR: 104 mL/min/1.73 (ref >59)

## 2024-01-10 LAB — LIPASE: Lipase: 51 U/L (ref 14–72)

## 2024-01-10 NOTE — Progress Notes (Signed)
 HI Waddell, your blood count is normal.  No sign of systemic infection.  Metabolic panel including liver and kidney function looks great too.  No sign of pancreatitis.  If you are still hurting the neck step would be to consider getting a CT. Would you be ok with that?

## 2024-01-16 ENCOUNTER — Encounter (INDEPENDENT_AMBULATORY_CARE_PROVIDER_SITE_OTHER): Payer: Self-pay | Admitting: Sports Medicine

## 2024-01-16 DIAGNOSIS — G8929 Other chronic pain: Secondary | ICD-10-CM

## 2024-01-16 DIAGNOSIS — M542 Cervicalgia: Secondary | ICD-10-CM

## 2024-01-17 NOTE — Telephone Encounter (Signed)

## 2024-02-05 ENCOUNTER — Encounter: Payer: Self-pay | Admitting: Sports Medicine

## 2024-03-11 ENCOUNTER — Other Ambulatory Visit: Payer: Self-pay

## 2024-03-11 MED ORDER — VALACYCLOVIR HCL 1 G PO TABS: 1000.0000 mg | ORAL_TABLET | Freq: Two times a day (BID) | ORAL | 0 refills | Status: DC

## 2024-03-11 NOTE — Progress Notes (Signed)
 Patient sent message for refill of Valtrex . Ask patient to call and make annual exam appointment.

## 2024-04-15 ENCOUNTER — Encounter: Payer: Self-pay | Admitting: Family Medicine

## 2024-04-16 NOTE — Progress Notes (Signed)
 ANNUAL EXAM Patient name: Sheila [name] MRN [id number]  Date of birth: 09/21/1995 Chief Complaint:   Gynecologic Exam (Pt had questions about Herpes, has new partner, IUD about 26 years old.)  History of Present Illness:   Sheila [name] is a 28 y.o. G51P1001 female being seen today for a routine annual exam.   Current concerns: Some spotting on Mirena . Spotting since a few months ago. Starting to get some period symptoms.   Current birth control: Mirena  10/2019  Patient's last menstrual period was 04/05/2024 (approximate).   Gardasil: Did not have.  Last Pap/Pap History:  2020: pap wnl 2024: pap normal  Review of Systems:   Pertinent items are noted in HPI Denies any headaches, blurred vision, fatigue, shortness of breath, chest pain, abdominal pain, abnormal vaginal discharge/itching/odor/irritation, problems with periods, bowel movements, urination, or intercourse unless otherwise stated above.  Pertinent History Reviewed:  Reviewed past medical,surgical, social and family history.  Reviewed problem list, medications and allergies. Physical Assessment:   Vitals:   04/17/24 1558  BP: 110/71  Pulse: 71  Weight: 131 lb (59.4 kg)  Height: 5' 2.5 (1.588 m)  Body mass index is 23.58 kg/m.   Physical Examination:  General appearance - well appearing, and in no distress Mental status - alert, oriented to person, place, and time Psych:  She has a normal mood and affect Skin - warm and dry, normal color, no suspicious lesions noted Chest - effort normal Heart - normal rate  Breasts - breasts appear normal, no suspicious masses, no skin or nipple changes or axillary nodes Abdomen - soft, nontender, nondistended, no masses or organomegaly Pelvic -  not indicated VULVA: Not examined VAGINA: Not examined CERVIX: Not examined UTERUS: Not examined ADNEXA: Not examined Extremities:  No swelling or varicosities noted  Chaperone present for exam  No results found for this or  any previous visit (from the past 24 hours).  Assessment & Plan:  [name] was seen today for gynecologic exam.  Diagnoses and all orders for this visit:  Encounter for annual routine gynecological examination - Cervical cancer screening: Discussed screening Q3 years. Reviewed importance of annual exams and limits of pap smear. Pap with reflex HPV up todate. Due to in 2027.  - GC/CT: Not indicated.  - Gardasil: has not yet had. Counseling provided and she declines - Birth Control: IUD - Breast Health: Encouraged self breast awareness/exams. Teaching provided. - Follow-up: 12 months and prn  IUD check up - Discussed if continued spotting she may choose to have it replaced. Still good for birth control for 8 years, but if bleeding issues, can replace sooner.   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 May have new partner. They have discussed her history.  We also discussed standard treatment should she become pregnant.  -     valACYclovir  (VALTREX ) 500 MG tablet; Take 1 tablet (500 mg total) by mouth daily. Can increase to twice a day for 5 days in the event of a recurrence    No orders of the defined types were placed in this encounter.   Meds:  Meds ordered this encounter  Medications   valACYclovir  (VALTREX ) 500 MG tablet    Sig: Take 1 tablet (500 mg total) by mouth daily. Can increase to twice a day for 5 days in the event of a recurrence    Dispense:  90 tablet    Refill:  3    Follow-up: No follow-ups on file.  Vina Solian, MD  04/17/2024 4:49 PM

## 2024-04-17 ENCOUNTER — Encounter: Payer: Self-pay | Admitting: Obstetrics and Gynecology

## 2024-04-17 ENCOUNTER — Ambulatory Visit: Admitting: Obstetrics and Gynecology

## 2024-04-17 VITALS — BP 110/71 | HR 71 | Ht 62.5 in | Wt 131.0 lb

## 2024-04-17 DIAGNOSIS — Z01419 Encounter for gynecological examination (general) (routine) without abnormal findings: Secondary | ICD-10-CM | POA: Diagnosis not present

## 2024-04-17 DIAGNOSIS — A6004 Herpesviral vulvovaginitis: Secondary | ICD-10-CM | POA: Diagnosis not present

## 2024-04-17 DIAGNOSIS — Z30431 Encounter for routine checking of intrauterine contraceptive device: Secondary | ICD-10-CM

## 2024-04-17 MED ORDER — VALACYCLOVIR HCL 500 MG PO TABS: 500.0000 mg | ORAL_TABLET | Freq: Every day | ORAL | 3 refills | Status: AC

## 2024-04-21 ENCOUNTER — Ambulatory Visit (INDEPENDENT_AMBULATORY_CARE_PROVIDER_SITE_OTHER): Admitting: Family Medicine

## 2024-04-21 VITALS — BP 108/67 | HR 80 | Ht 62.5 in | Wt 130.1 lb

## 2024-04-21 DIAGNOSIS — Z0000 Encounter for general adult medical examination without abnormal findings: Secondary | ICD-10-CM | POA: Diagnosis not present

## 2024-04-21 NOTE — Progress Notes (Signed)
 Complete physical exam  Patient: Sheila [name]    DOB: 04-13-1996 28 y.o.   MRN: [id number]  Chief Complaint  Patient presents with   Annual Exam    She has a place on L cheek she would like checked    Subjective:    Sheila [name] is a 28 y.o. female who presents today for a complete physical exam. She reports consuming a general diet. Very active at job but doesn't exercise outside of work.  She generally feels well. She reports sleeping fair, hard time if things on her mind. She does not have additional problems to discuss today.   Discussed the use of AI scribe software for clinical note transcription with the patient, who gave verbal consent to proceed.  History of Present Illness    Most recent fall risk assessment:    04/21/2024    8:33 AM  Fall Risk   Falls in the past year? 0  Number falls in past yr: 0  Injury with Fall? 0  Risk for fall due to : No Fall Risks  Follow up Falls evaluation completed     Most recent depression screenings:    04/21/2024    8:33 AM 04/17/2024    4:04 PM  PHQ 2/9 Scores  PHQ - 2 Score 0 1  PHQ- 9 Score 2 4        Patient Care Team: Alvan Dorothyann BIRCH, MD as PCP - General (Family Medicine)   ROS    Objective:    BP 108/67   Pulse 80   Ht 5' 2.5 (1.588 m)   Wt 130 lb 1.3 oz (59 kg)   LMP 04/05/2024 (Approximate)   SpO2 100%   BMI 23.41 kg/m     Physical Exam Constitutional:      Appearance: Normal appearance.  HENT:     Head: Normocephalic and atraumatic.     Right Ear: Tympanic membrane, ear canal and external ear normal.     Left Ear: Tympanic membrane, ear canal and external ear normal.     Nose: Nose normal.     Mouth/Throat:     Pharynx: Oropharynx is clear.  Eyes:     Extraocular Movements: Extraocular movements intact.     Conjunctiva/sclera: Conjunctivae normal.     Pupils: Pupils are equal, round, and reactive to light.  Neck:     Thyroid: No thyromegaly.  Cardiovascular:     Rate  and Rhythm: Normal rate and regular rhythm.  Pulmonary:     Effort: Pulmonary effort is normal.     Breath sounds: Normal breath sounds.  Abdominal:     General: Bowel sounds are normal.     Palpations: Abdomen is soft.     Tenderness: There is no abdominal tenderness.  Musculoskeletal:        General: No swelling.     Cervical back: Neck supple.  Skin:    General: Skin is warm and dry.  Neurological:     Mental Status: She is oriented to person, place, and time.  Psychiatric:        Mood and Affect: Mood normal.        Behavior: Behavior normal.       No results found for any visits on 04/21/24.       Assessment & Plan:    Routine Health Maintenance and Physical Exam Immunization History  Administered Date(s) Administered   DTP 11/27/1997, 01/27/1998, 03/29/1998, 09/28/1998, 10/02/2001   HIB (PRP-OMP) 11/27/1997, 01/27/1998, 03/29/1998, 09/28/1998  Hepatitis A 10/02/2001, 01/17/2008   Hepatitis B 10/12/1997, 11/27/1997, 03/29/1998   MMR 09/28/1998, 10/02/2001   Meningococcal B, OMV 12/22/2015   Meningococcal Conjugate 02/06/2012, 12/22/2015   OPV 11/27/1997, 01/27/1998, 03/29/1998, 10/02/2001   PPD Test 12/20/2015, 01/16/2017   Td 01/17/2008   Tdap 10/03/2019   Varicella 09/28/1998, 01/26/2006    Health Maintenance  Topic Date Due   HPV VACCINES (1 - 3-dose series) Never done   Hepatitis C Screening  Never done   Meningococcal B Vaccine (2 of 2 - Bexsero SCDM 2-dose series) 06/23/2016   Pneumococcal Vaccine (1 of 2 - PCV) Never done   COVID-19 Vaccine (1 - 2025-26 season) Never done   Influenza Vaccine  09/02/2024 (Originally 01/04/2024)   Cervical Cancer Screening (Pap smear)  02/14/2026   DTaP/Tdap/Td (8 - Td or Tdap) 10/02/2029   Hepatitis B Vaccines 19-59 Average Risk  Completed   HIV Screening  Completed    Discussed health benefits of physical activity, and encouraged her to engage in regular exercise appropriate for her age and condition.  Problem  List Items Addressed This Visit   None Visit Diagnoses       Wellness examination    -  Primary       Encouraged her to consider HPV vaccine.   Encouraged to keep up exercise and activity level  Some stressors bc of struggling with her daughter Mila father, custody issues.   Assessment and Plan Assessment & Plan     No follow-ups on file.    Dorothyann Byars, MD [facility] at [facility]

## 2024-06-03 ENCOUNTER — Encounter: Admitting: Family Medicine

## 2024-06-16 ENCOUNTER — Ambulatory Visit: Payer: Self-pay

## 2024-06-16 NOTE — Telephone Encounter (Signed)
 FYI Only or Action Required?: FYI only for provider: appointment scheduled on 06/17/24.  Patient was last seen in primary care on 04/21/2024 by Sheila Dorothyann BIRCH, MD.  Called Nurse Triage reporting Headache, Fatigue, Cough, and Shortness of Breath.  Symptoms began Sheila [name] week ago.  Interventions attempted: OTC medications: Vitamin C, Dayquil, fluids.  Symptoms are: unchanged.  Triage Disposition: See Today or Tomorrow in Office (overriding Home Care)  Patient/caregiver understands and will follow disposition?: Yes                Copied from CRM #[id number]. Topic: Clinical - Red Word Triage >> Jun 16, 2024  8:47 AM Sheila Sheila [name] wrote: Red Word that prompted transfer to Nurse Triage: Patient is having shortness of breath, chest tightness, back and head hurting. Reason for Disposition  Cough  Answer Assessment - Initial Assessment Questions 1. ONSET: When did the cough begin?      X 1 week  2. SEVERITY: How bad is the cough today?      Sounds like Sheila [name] beat box. Moderate. Chest and back tightness.  3. SPUTUM: Describe the color of your sputum (e.g., none, dry cough; clear, white, yellow, green)     Dry cough.  4. HEMOPTYSIS: Are you coughing up any blood? If Yes, ask: How much? (e.g., flecks, streaks, tablespoons, etc.)     No.  5. DIFFICULTY BREATHING: Are you having difficulty breathing? If Yes, ask: How bad is it? (e.g., mild, moderate, severe)      Yes, states that started on 06/11/24.  6. FEVER: Do you have Sheila [name] fever? If Yes, ask: What is your temperature, how was it measured, and when did it start?     Unsure, did not measure but felt warm on Wednesday.  7. CARDIAC HISTORY: Do you have any history of heart disease? (e.g., heart attack, congestive heart failure)      No.  8. LUNG HISTORY: Do you have any history of lung disease?  (e.g., pulmonary embolus, asthma, emphysema)     Exercise induced asthma.  9. PE RISK FACTORS: Do you have Sheila [name]  history of blood clots? (or: recent major surgery, recent prolonged travel, bedridden)     No.  10. OTHER SYMPTOMS: Do you have any other symptoms? (e.g., runny nose, wheezing, chest pain)       Fatigue, headache lasted 48 hours started on 06/07/24, diarrhea x 1 week (1-2 episodes in the past 24 hours), body aches. No wheezing,   11. PREGNANCY: Is there any chance you are pregnant? When was your last menstrual period?       Has IUD in place, unsure LMP.  12. TRAVEL: Have you traveled out of the country in the last month? (e.g., travel history, exposures)       No travel. She states her mother was sick the week before her symptoms started.  Protocols used: Cough - Acute Non-Productive-Sheila [name]-AH

## 2024-06-17 ENCOUNTER — Ambulatory Visit: Admitting: Family Medicine

## 2024-06-17 ENCOUNTER — Encounter: Payer: Self-pay | Admitting: Family Medicine

## 2024-06-17 VITALS — BP 119/64 | HR 72 | Temp 98.4°F | Ht 62.5 in | Wt 132.0 lb

## 2024-06-17 DIAGNOSIS — R051 Acute cough: Secondary | ICD-10-CM

## 2024-06-17 DIAGNOSIS — J0110 Acute frontal sinusitis, unspecified: Secondary | ICD-10-CM | POA: Diagnosis not present

## 2024-06-17 LAB — POC SOFIA 2 FLU + SARS ANTIGEN FIA
Influenza A, POC: NEGATIVE
Influenza B, POC: NEGATIVE
SARS Coronavirus 2 Ag: NEGATIVE

## 2024-06-17 MED ORDER — DOXYCYCLINE HYCLATE 100 MG PO TABS: 100.0000 mg | ORAL_TABLET | Freq: Two times a day (BID) | ORAL | 0 refills | Status: DC

## 2024-06-17 NOTE — Progress Notes (Signed)
 "  Acute Office Visit  Patient ID: Sheila [name], female    DOB: [birth year], 29 y.o.   MRN: [id number]  PCP: Alvan Dorothyann BIRCH, MD  Chief Complaint  Patient presents with   Cough   Fatigue    Pt reports that her sxs started 1 wk ago she has been taking dayquil and tylenol     Subjective:     HPI  Discussed the use of AI scribe software for clinical note transcription with the patient, who gave verbal consent to proceed.  History of Present Illness Sheila [name] is a 29 year old female who presents with persistent headache and chest symptoms for 10 days.  Headache - Onset 10 days ago with severe, persistent headache lasting 48 hours - Currently intermittent with occasional sharp pain  Constitutional symptoms - Weakness and fatigue following onset of headache - Increased sleep  Chest discomfort and dyspnea - Chest discomfort described as a sensation of a 'speaker' in the chest - Symptoms worsen with extensive talking - Intermittent shortness of breath  Cough and upper respiratory symptoms - Dry cough - Intermittent nasal congestion with stuffy sensation - Mild nasal drainage, not excessive - No sore throat - No significant nasal discharge   ROS     Objective:    BP 119/64   Pulse 72   Temp 98.4 F (36.9 C) (Oral)   Ht 5' 2.5 (1.588 m)   Wt 132 lb (59.9 kg)   SpO2 100%   BMI 23.76 kg/m    Physical Exam Constitutional:      Appearance: Normal appearance.  HENT:     Head: Normocephalic and atraumatic.     Right Ear: Tympanic membrane, ear canal and external ear normal. There is no impacted cerumen.     Left Ear: Tympanic membrane, ear canal and external ear normal. There is no impacted cerumen.     Nose: Nose normal.     Mouth/Throat:     Pharynx: Oropharynx is clear.  Eyes:     Conjunctiva/sclera: Conjunctivae normal.  Cardiovascular:     Rate and Rhythm: Normal rate and regular rhythm.  Pulmonary:     Effort: Pulmonary effort is normal.      Breath sounds: Normal breath sounds.  Musculoskeletal:     Cervical back: Neck supple. No tenderness.  Lymphadenopathy:     Cervical: No cervical adenopathy.  Skin:    General: Skin is warm and dry.  Neurological:     Mental Status: She is alert and oriented to person, place, and time.  Psychiatric:        Mood and Affect: Mood normal.       Results for orders placed or performed in visit on 06/17/24  POC SOFIA 2 FLU + SARS ANTIGEN FIA  Result Value Ref Range   Influenza A, POC Negative Negative   Influenza B, POC Negative Negative   SARS Coronavirus 2 Ag Negative Negative       Assessment & Plan:   Problem List Items Addressed This Visit   None Visit Diagnoses       Acute cough    -  Primary   Relevant Orders   POC SOFIA 2 FLU + SARS ANTIGEN FIA (Completed)     Acute non-recurrent frontal sinusitis       Relevant Medications   doxycycline  (VIBRA -TABS) 100 MG tablet       Assessment and Plan Assessment & Plan Acute frontal sinusitis Symptoms persisted for 10 days with headaches, congestion, and  pressure. Treated for sinusitis due to prolonged symptoms. - Prescribed antibiotic for sinusitis. - Advised to start antibiotic if symptoms persist. - Instructed to report if no improvement after antibiotics.  Acute cough Dry cough for 10 days with chest discomfort and shortness of breath. No pneumonia. - Advised use of cold medicine as needed.    Meds ordered this encounter  Medications   doxycycline  (VIBRA -TABS) 100 MG tablet    Sig: Take 1 tablet (100 mg total) by mouth 2 (two) times daily.    Dispense:  14 tablet    Refill:  0    Return if symptoms worsen or fail to improve.  Dorothyann Byars, MD [facility] at [facility]   "

## 2024-07-10 ENCOUNTER — Ambulatory Visit: Admitting: Family Medicine

## 2024-07-10 ENCOUNTER — Encounter: Payer: Self-pay | Admitting: Family Medicine

## 2024-07-10 VITALS — BP 121/74 | HR 82 | Ht 62.25 in | Wt 132.0 lb

## 2024-07-10 DIAGNOSIS — L659 Nonscarring hair loss, unspecified: Secondary | ICD-10-CM | POA: Diagnosis not present

## 2024-07-10 MED ORDER — CLOBETASOL PROPIONATE 0.05 % EX SOLN: 1.0000 | Freq: Two times a day (BID) | CUTANEOUS | 1 refills | Status: AC | PRN

## 2024-07-10 MED ORDER — CLOBETASOL PROPIONATE 0.05 % EX SOLN: 1.0000 | Freq: Two times a day (BID) | CUTANEOUS | 0 refills | Status: DC

## 2024-07-10 NOTE — Progress Notes (Signed)
" ° °  Acute Office Visit  Patient ID: Sheila [name], female    DOB: 25-Jun-1995, 29 y.o.   MRN: [id number]  PCP: Alvan Dorothyann BIRCH, MD  Chief Complaint  Patient presents with   Alopecia    Subjective:     HPI  Discussed the use of AI scribe software for clinical note transcription with the patient, who gave verbal consent to proceed.  History of Present Illness Sheila [name] is a 29 year old female who presents with recurrent stress-induced scalp spots.  Scalp lesions - Recurrent scalp spots since senior year of high school, onset several years ago - Lesions appear during periods of psychological stress - Previously diagnosed as 'stress spots' by a dermatologist - No associated hair pulling behavior, except infrequently when straightening hair - Certain hairstyles involving scalp traction and use of gel   Topical treatment - Uses a topical liquid medication, applied directly to affected areas - Topical treatment is effective in resolving lesions, but recurrences occur over time - Currently using a 50 ml bottle of the topical medication   ROS     Objective:    BP 121/74   Pulse 82   Ht 5' 2.25 (1.581 m)   Wt 132 lb (59.9 kg)   SpO2 98%   BMI 23.95 kg/m    Physical Exam Vitals reviewed.  Constitutional:      Appearance: Normal appearance.  HENT:     Head: Normocephalic.  Cardiovascular:     Rate and Rhythm: Normal rate and regular rhythm.  Pulmonary:     Effort: Pulmonary effort is normal.  Neurological:     Mental Status: She is alert and oriented to person, place, and time.  Psychiatric:        Mood and Affect: Mood normal.        Behavior: Behavior normal.       No results found for any visits on 07/10/24.     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 Current management includes topical medication. - Prescribed new topical medication for hair growth, same size bottle as previous. - Advised biotin supplementation for hair growth. - Discussed potential dermatologist referral if treatment ineffective.   Encouraged he to consider getting the HPV vaccine.    Meds ordered this encounter  Medications   clobetasol  (TEMOVATE ) 0.05 % external solution    Sig: Apply 1 Application topically 2 (two) times daily as needed.    Dispense:  50 mL    Refill:  1   DISCONTD: clobetasol  (TEMOVATE ) 0.05 % external solution    Sig: Apply 1 Application topically 2 (two) times daily.    Dispense:  50 mL    Refill:  0    No follow-ups on file.  Dorothyann Alvan, MD [facility] at [facility]   "
